# Patient Record
Sex: Female | Born: 1995 | Race: Black or African American | Hispanic: No | Marital: Single | Smoking: Never smoker
Health system: Southern US, Community
[De-identification: ages and names within clinical notes are randomized; demographics above are authoritative.]

## PROBLEM LIST (undated history)

## (undated) DIAGNOSIS — Z9109 Other allergy status, other than to drugs and biological substances: Secondary | ICD-10-CM

## (undated) DIAGNOSIS — J45909 Unspecified asthma, uncomplicated: Secondary | ICD-10-CM

## (undated) DIAGNOSIS — E119 Type 2 diabetes mellitus without complications: Secondary | ICD-10-CM

## (undated) DIAGNOSIS — E669 Obesity, unspecified: Secondary | ICD-10-CM

## (undated) DIAGNOSIS — I10 Essential (primary) hypertension: Secondary | ICD-10-CM

## (undated) DIAGNOSIS — E063 Autoimmune thyroiditis: Secondary | ICD-10-CM

## (undated) DIAGNOSIS — L83 Acanthosis nigricans: Secondary | ICD-10-CM

## (undated) HISTORY — DX: Obesity, unspecified: E66.9

## (undated) HISTORY — DX: Other allergy status, other than to drugs and biological substances: Z91.09

## (undated) HISTORY — DX: Acanthosis nigricans: L83

## (undated) HISTORY — DX: Unspecified asthma, uncomplicated: J45.909

## (undated) HISTORY — DX: Essential (primary) hypertension: I10

## (undated) HISTORY — DX: Type 2 diabetes mellitus without complications: E11.9

## (undated) HISTORY — DX: Autoimmune thyroiditis: E06.3

## (undated) HISTORY — PX: OTHER SURGICAL HISTORY: SHX169

---
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
## 2013-02-09 ENCOUNTER — Other Ambulatory Visit: Payer: Self-pay | Admitting: *Deleted

## 2013-02-23 LAB — COMPREHENSIVE METABOLIC PANEL
Albumin: 4.3 g/dL (ref 3.5–5.2)
CO2: 31 mEq/L (ref 19–32)
Calcium: 9.9 mg/dL (ref 8.4–10.5)
Chloride: 99 mEq/L (ref 96–112)
Glucose, Bld: 244 mg/dL — ABNORMAL HIGH (ref 70–99)
Sodium: 138 mEq/L (ref 135–145)
Total Bilirubin: 0.5 mg/dL (ref 0.3–1.2)
Total Protein: 7.5 g/dL (ref 6.0–8.3)

## 2013-02-23 LAB — LIPID PANEL
HDL: 41 mg/dL (ref >39)
Total CHOL/HDL Ratio: 4.2 Ratio

## 2013-02-24 LAB — MICROALBUMIN / CREATININE URINE RATIO
Creatinine, Urine: 188.1 mg/dL
Microalb, Ur: 23.73 mg/dL — ABNORMAL HIGH (ref 0.00–1.89)

## 2013-02-24 LAB — C-PEPTIDE: C-Peptide: 4.61 ng/mL — ABNORMAL HIGH (ref 0.80–3.90)

## 2013-02-26 ENCOUNTER — Ambulatory Visit (INDEPENDENT_AMBULATORY_CARE_PROVIDER_SITE_OTHER): Payer: Medicaid Other | Admitting: "Endocrinology

## 2013-02-26 VITALS — BP 117/81 | HR 77 | Wt 241.6 lb

## 2013-02-26 DIAGNOSIS — E1065 Type 1 diabetes mellitus with hyperglycemia: Secondary | ICD-10-CM

## 2013-02-26 DIAGNOSIS — E1129 Type 2 diabetes mellitus with other diabetic kidney complication: Secondary | ICD-10-CM

## 2013-02-26 DIAGNOSIS — E063 Autoimmune thyroiditis: Secondary | ICD-10-CM

## 2013-02-26 DIAGNOSIS — L680 Hirsutism: Secondary | ICD-10-CM

## 2013-02-26 DIAGNOSIS — R809 Proteinuria, unspecified: Secondary | ICD-10-CM

## 2013-02-26 DIAGNOSIS — E7800 Pure hypercholesterolemia, unspecified: Secondary | ICD-10-CM

## 2013-02-26 DIAGNOSIS — B353 Tinea pedis: Secondary | ICD-10-CM

## 2013-02-26 DIAGNOSIS — I10 Essential (primary) hypertension: Secondary | ICD-10-CM

## 2013-02-26 DIAGNOSIS — E049 Nontoxic goiter, unspecified: Secondary | ICD-10-CM

## 2013-02-26 DIAGNOSIS — E038 Other specified hypothyroidism: Secondary | ICD-10-CM

## 2013-02-26 LAB — TESTOSTERONE, FREE, TOTAL, SHBG: Testosterone: 90 ng/dL — ABNORMAL HIGH (ref 15–40)

## 2013-02-26 LAB — POCT GLYCOSYLATED HEMOGLOBIN (HGB A1C): Hemoglobin A1C: 9.1

## 2013-02-26 MED ORDER — LISINOPRIL 10 MG PO TABS: 10.0000 mg | ORAL_TABLET | Freq: Every day | ORAL | Status: DC

## 2013-02-26 MED ORDER — LEVOTHYROXINE SODIUM 50 MCG PO TABS: 50.0000 ug | ORAL_TABLET | Freq: Every day | ORAL | Status: DC

## 2013-02-26 NOTE — Progress Notes (Signed)
Subjective:  Patient Name: Shelley Payne Date of Birth: 08-01-1995  MRN: [id number]  Shelley Payne  presents to the office today for follow-up evaluation and management of her T2DM, obesity, goiter, hirsutism, thyroiditis, acanthosis, hypothyroidism, hypertension, intolerance to generic metformin  HISTORY OF PRESENT ILLNESS:   Shelley Payne is a 17 y.o. African-American young woman.   Shelley Payne was unaccompanied.  1. Shelley Payne first presented to our clinic on 05/27/06 in referral from [place] [facility] for evaluation and management of newly diagnosed diabetes. She was then 18 years old.  A. On 05/17/2006 she had her annual PE. Glycosuria was noted. CBG was 350. Metformin was started at a dose of 500 mg at supper. Subsequent lab results showed a C-peptide of 12.63 (normal 0.80-3.90), HbA1c 7.6%, Anti-insulin antibody 0.2 (normal < 1.0), pancreatic islet cell antibody 5 (normal <5), TSH 2.934, free T4 0.88, and free T3 3.4  B. At her first visit, I obtained the following history. She had been born at 41 weeks and weighed 7 pounds, 3 oz. She was healthy in infancy and early childhood. She began to gain excessive weight around 17 years of age. By age 20 or 8 she had developed acanthosis nigricans of her posterior neck. At age 49 she developed sideburns and at age 3-12 she developed a mustache . She had had no surgeries or medication allergies. Her FH was positive for T2DM in the maternal grandfather and obesity in her mother and some maternal aunts. Paternal FH was unknown. SH included the fact that she and her two brothers lived with their mother. Dad was not in the picture. Her Reviw of Systems was positive for severe belly hunger/dyspepsia.   C. On physical exam her height was 157.2 cm, about the 65% for age. Her weight was 212.7 lbs, far exceeding the 97%. Her BMI was 39.3, also far exceeding the 97%. She was morbidly obese. She had 3+sideburns and 1-2+ mustache. Her thyroid gland was enlarged at  25+ gms. She had 4+ acanthosis of the posterior neck. Her abdomen was very large. I concurred with he diagnosis of T2DM, although the borderline elevation of her anti-GAD antibody was suspicious for the possibility of autoimmune diabetes in the future. I increased her metformin to 500 mg, twice daily and arranged for further diabetes education at our clinic and [facility].  2. During the subsequent 6 years, she has continued with her original problems and developed some new ones.    A. Obesity and T2DM: Her obesity and T2DM have remained essentially unchanged. Although she did lose weight in the first year of coming to [place], she subsequently stopped taking metformin and continued to gradually gain weight. On 05/25/10 she reached her maximum weight up until then of 236 lbs. Thereafter her weight decreased to 232. Her HbA1c values varied from 5.9% in early 2008 when she was losing weight to 9.5% when she was at her maximum weight. Onglyza, 5 mg/day, was initiated on 07/05/10. Her HbA1c subsequently decreased to 6.8%.  B. 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 She was hypothyroid.Her TPO antibody was markedly positive for Hashimoto's disease at 963.1. She started on Synthroid 25 mcg/day. On 07/02/08 I increased her Synthroid to 37.5 mcg/day, but she never increased the dose. Despite trying to contact her mother and [place] several times, we were never able to ensure that she increased the Synthroid dose.   C. 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 The hyperinsulinemia, in turn, caused an elevated testosterone level. Her testosterone was 119.53 on 06/05/07. She has used depilatory creams since then.   D. Acanthosis nigricans: The acanthosis nigricans is also due to hyperinsulinemia. Insulin directly stimulates the basal layer of the skin, causing  thickening and darkening.   E. Hypertension: BPs increased in parallel with weight gain.  F. Microalbuminuria: Her microalbumin/creatinine ratio was elevated at 77.7 on 04/27/10. Lisinopril, 2.5 mg, was started.    3. The patient's last [facility] visit was on 06/01/12. In the interim, she has been healthy. She misses many doses of her medications and has not been checking her BGs very often. Her medications include Synthroid, 25 mcg/day; lisinopril, 5 mg/day; and Onglyza 5 mg/day. She takes metformin, 500 mg in the mornings. She has not been walking during the summer months. Her nasal allergies are getting worse. She has had more chest tightness and wheezing this year.    4. Pertinent Review of Systems:  Constitutional: The patient feels "good". The patient seems healthy and active.  Eyes: Her vision is good with her current eyeglass prescription. She had an eye exam last November. There are no other recognized eye problems. Neck: The patient has no complaints of anterior neck swelling, soreness, tenderness, pressure, discomfort, or difficulty swallowing.   Heart: Heart rate increases with exercise or other physical activity. The patient has no complaints of palpitations, irregular heart beats, chest pain, or chest pressure.   Gastrointestinal: Bowel movents seem normal. The patient has no complaints of excessive hunger, acid reflux, upset stomach, stomach aches or pains, diarrhea, or constipation.  Legs: Muscle mass and strength seem normal. There are no complaints of numbness, tingling, burning, or pain. No edema is noted.  Feet: There are no obvious foot problems. There are no complaints of numbness, tingling, burning, or pain. No edema is noted. Neurologic: There are no recognized problems with muscle movement and strength, sensation, or coordination. GYN: LMP is now. Periods have been regular.  Skin: Facial hair is not better or worse. She still uses depilatory creams 1-2 times per month.   PAST  MEDICAL, FAMILY, AND SOCIAL HISTORY  Past Medical History  Diagnosis Date  . Asthma   . Environmental allergies   . Obesity   . Thyroiditis, autoimmune   . Hypothyroidism, acquired, autoimmune   . Diabetes mellitus, type II   . Acanthosis nigricans, acquired   . Hypertension     Family History  Problem Relation Age of Onset  . Obesity Mother   . Obesity Maternal Aunt   . Diabetes Maternal Grandfather     Current outpatient prescriptions:lisinopril (PRINIVIL,ZESTRIL) 2.5 MG tablet, take 1 tablet by mouth once daily, Disp: 30 tablet, Rfl: 5;  metFORMIN (GLUCOPHAGE) 500 MG tablet, Take 500 mg by mouth 2 (two) times daily with a meal., Disp: , Rfl: ;  ONGLYZA 5 MG TABS tablet, take 1 tablet by mouth once daily, Disp: 30 tablet, Rfl: 5;  SYNTHROID 25 MCG tablet, take 1 and 1/2 tablet by mouth once daily, Disp: 30 each, Rfl: 6  Allergies as of 02/26/2013  . (No Known Allergies)     reports that she has never smoked. She has never used smokeless tobacco. She reports that she does not drink alcohol or use illicit drugs. Pediatric History  Patient Guardian Status  . Not on file.   Other Topics Concern  . Not on file   Social History Narrative  . No narrative on file    1.  School and Family: She graduated from high school She will start at [employer] on 03/12/13. 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  2. Activities: She plans to walk more. 3. Primary Care Provider: Family Practice at [facility]  REVIEW OF SYSTEMS: There are no other significant problems involving Shelley Payne's other body systems.   Objective:  Vital Signs:  BP 117/81  Pulse 77  Wt 241 lb 9.6 oz (109.589 kg)  She weighed 233 on 06/01/12 and 232 on 09/01/10. Her height in 2012 was 158.6 cm and her BMI was 42. Ht Readings from Last 3 Encounters:  No data found for Ht   Wt Readings from Last 3 Encounters:  02/26/13 241 lb 9.6 oz (109.589 kg) (99%*, Z = 2.41)  06/01/12 233 lb 12.8 oz (106.051 kg) (99%*, Z = 2.33)   *  Growth percentiles are based on CDC 2-20 Years data.   HC Readings from Last 3 Encounters:  No data found for Shelley Payne   There is no height on file to calculate BSA. No height on file for this encounter. 99%ile (Z=2.41) based on CDC 2-20 Years weight-for-age data.    PHYSICAL EXAM:  Constitutional: The patient appears healthy, but quite obese. She has gained about one pound per month for the past 8 months.  Head: The head is normocephalic. Face: The face appears normal, except for facial hair. She has a 1+ moustache. It has been several weeks since she's used her depilatory cream. She has grade 1 sideburns. Her cheeks have essentially no hair present. There are no obvious dysmorphic features. Eyes: The eyes appear to be normally formed and spaced. Gaze is conjugate. There is no obvious arcus or proptosis. Moisture appears normal. Ears: The ears are normally placed and appear externally normal. Mouth: The oropharynx and tongue appear normal. Dentition appears to be normal for age. Oral moisture is normal. Neck: The neck appears to be visibly normal. No carotid bruits are noted. The thyroid gland is 20-25 grams in size. The consistency of the thyroid gland is relatively firm. The thyroid gland is not tender to palpation. Lungs: The lungs are clear to auscultation. Air movement is good. Heart: Heart rate and rhythm are regular. Heart sounds S1 and S2 are normal. I did not appreciate any pathologic cardiac murmurs. Abdomen: The abdomen is quite enlarged. Bowel sounds are normal. There is no obvious hepatomegaly, splenomegaly, or other mass effect.  Arms: Muscle size and bulk are normal for age. Hands: There is no obvious tremor. Phalangeal and metacarpophalangeal joints are normal. Palmar muscles are normal for age. Palmar skin is normal. Palmar moisture is also normal. Legs: Muscles appear normal for age. No edema is present. Feet: Dorsalis pedis pulses are faint 1+. She has 1+ tinea pedis.   Neurologic: Strength is normal for age in both the upper and lower extremities. Muscle tone is normal. Sensation to touch is normal in both the legs and the soles of the feet.     LAB DATA:   Results for orders placed in visit on 02/26/13 (from the past 504 hour(s))  GLUCOSE, POCT (MANUAL RESULT ENTRY)   Collection Time    02/26/13  8:19 AM      Result Value Range   POC Glucose 257 (*) 70 - 99 mg/dl  POCT GLYCOSYLATED HEMOGLOBIN (HGB A1C)   Collection Time    02/26/13  8:21 AM      Result Value Range   Hemoglobin A1C 9.1    Results for orders placed in visit  on 02/09/13 (from the past 504 hour(s))  C-PEPTIDE   Collection Time    02/23/13  2:41 PM      Result Value Range   C-Peptide 4.61 (*) 0.80 - 3.90 ng/mL  T3, FREE   Collection Time    02/23/13  2:41 PM      Result Value Range   T3, Free 3.1  2.3 - 4.2 pg/mL  T4, FREE   Collection Time    02/23/13  2:41 PM      Result Value Range   Free T4 0.92  0.80 - 1.80 ng/dL  TSH   Collection Time    02/23/13  2:41 PM      Result Value Range   TSH 5.529 (*) 0.350 - 4.500 uIU/mL  LIPID PANEL   Collection Time    02/23/13  2:41 PM      Result Value Range   Cholesterol 174  0 - 200 mg/dL   Triglycerides 409  <811 mg/dL   HDL 41  >91 mg/dL   Total CHOL/HDL Ratio 4.2     VLDL 21  0 - 40 mg/dL   LDL Cholesterol 478 (*) 0 - 99 mg/dL  TESTOSTERONE, FREE, TOTAL   Collection Time    02/23/13  2:41 PM      Result Value Range   Testosterone 90 (*) 15 - 40 ng/dL   Sex Hormone Binding       Testosterone, Free       Testosterone-% Freee.      COMPREHENSIVE METABOLIC PANEL   Collection Time    02/23/13  2:41 PM      Result Value Range   Sodium 138  135 - 145 mEq/L   Potassium 4.6  3.5 - 5.3 mEq/L   Chloride 99  96 - 112 mEq/L   CO2 31  19 - 32 mEq/L   Glucose, Bld 244 (*) 70 - 99 mg/dL   BUN 9  6 - 23 mg/dL   Creat 2.95  6.21 - 3.08 mg/dL   Total Bilirubin 0.5  0.3 - 1.2 mg/dL   Alkaline Phosphatase 80  39 - 117 U/L   AST  40 (*) 0 - 37 U/L   ALT 62 (*) 0 - 35 U/L   Total Protein 7.5  6.0 - 8.3 g/dL   Albumin 4.3  3.5 - 5.2 g/dL   Calcium 9.9  8.4 - 65.7 mg/dL  MICROALBUMIN / CREATININE URINE RATIO   Collection Time    02/23/13  2:41 PM      Result Value Range   Microalb, Ur 23.73 (*) 0.00 - 1.89 mg/dL   Creatinine, Urine 846.9     Microalb Creat Ratio 126.2 (*) 0.0 - 30.0 mg/g  Her HbA1c was 9.1% today, compared with 7.6% in November 2013 and with 6.8% in February 2012.  Labs 8.02.14: C-peptide 4.61 (0.80-3.90); TSH 5.529, free T4 0.92, free T3 3.1; cholesterol 174, triglycerides 06, HDL 41, LDL 112; testosterone 90, free testosterone 23.1; CMP normal, except for glucose 244, AST 40, and LT 62; microalbumin/creatinine ratio 126.2 Labs 11/06/10: TSH 6.792, free T4 0.98, free T3 3.4   Assessment and Plan:   ASSESSMENT:  1. T2DM: Her BG control is much worse. Despite her best intentions she simply has not made the lifestyle changes that she needs to make.  2. Obesity: She is still morbidly obese.  3. Goiter: Her thyroid gland is about the same size as it was in 2012 and 2013.  4. Hypothyroid, acquired:  She is hypothyroid again, presumably due to continuing losses of thyrocytes. Assuming that she is telling the truth when she says that she takes her Synthroid reliably, it is time to increase her dose.   5. Hirsutism: Her facial hair looks less obvious because she has been using a depilatory cream. The distribution of facial hair, however, is unchanged. Her testosterone level is essentially unchanged and is about 3 X the upper limit of normal.  6. Hypertension: Her BP is not better. She needs an increase in lisinopril.  7. Microalbuminuria: Her urinary microalbumin/creatinine ratio is much too high. Fortunately, this is reversible if she gets her DM and HTN under control.  8. Hashimoto's thyroiditis: Her thyroiditis is clinically quiescent.  9. Tinea pedis: This is mild today.  10. Hypercholesterolemia: This is  worse, due mostly to weight gain.   PLAN:  1. Diagnostic: TFTs, urinary microalbumin/creatinine ratio in 2 monthsl. 2. Therapeutic: Increase Synthroid to 50 mcg/day. Increase lisinopril to 10 mg/day. Start Victoza, 0.6 mg/day. Check BGs before breakfast and before supper. Take all meds as prescribed. Eat Right Diet and Shelley Payne [facility] Diet. Refer to [place] [facility]. Ketoconazole, 2% cream, bid. Walk an hour per day.  3. Patient education: We discussed the vicious cycle of weight gain, cytokine production, insulin resistance, hyperinsulinemia, and adverse effects of both the cytokines and the hyperinsulinemia. Our clinic nurse, Gearldine Bienenstock, and I taught her how to use the Victoza pen. 4. Follow-up: 1 month  Level of Service: This visit lasted in excess of 90 minutes. More than 50% of the visit was devoted to counseling.  David Stall

## 2013-02-26 NOTE — Patient Instructions (Signed)
Follow up visit in 1 month. Start Victoza, 0.6 mg by subcutaneous injection each morning before breakfa7r. Increase synthroid to 50 mcg each morning. Increase lisinopril to 10 mg each morning. Call Dr. Fransico Michael next Monday evening between 8-10 PM.

## 2013-02-27 ENCOUNTER — Encounter: Payer: Self-pay | Admitting: "Endocrinology

## 2013-04-04 ENCOUNTER — Ambulatory Visit: Payer: Medicaid Other | Admitting: "Endocrinology

## 2013-08-03 ENCOUNTER — Ambulatory Visit: Payer: Self-pay | Admitting: Pediatrics

## 2013-08-30 ENCOUNTER — Ambulatory Visit: Payer: Self-pay | Admitting: Pediatrics

## 2014-05-31 ENCOUNTER — Ambulatory Visit (INDEPENDENT_AMBULATORY_CARE_PROVIDER_SITE_OTHER): Payer: Medicaid Other | Admitting: Family Medicine

## 2014-05-31 ENCOUNTER — Encounter: Payer: Self-pay | Admitting: Family Medicine

## 2014-05-31 VITALS — BP 131/87 | HR 78 | Temp 98.1°F | Ht 62.0 in | Wt 235.9 lb

## 2014-05-31 DIAGNOSIS — E038 Other specified hypothyroidism: Secondary | ICD-10-CM | POA: Diagnosis not present

## 2014-05-31 DIAGNOSIS — Z7689 Persons encountering health services in other specified circumstances: Secondary | ICD-10-CM

## 2014-05-31 DIAGNOSIS — Z7189 Other specified counseling: Secondary | ICD-10-CM | POA: Diagnosis not present

## 2014-05-31 DIAGNOSIS — I10 Essential (primary) hypertension: Secondary | ICD-10-CM | POA: Diagnosis not present

## 2014-05-31 DIAGNOSIS — N939 Abnormal uterine and vaginal bleeding, unspecified: Secondary | ICD-10-CM | POA: Diagnosis not present

## 2014-05-31 DIAGNOSIS — E063 Autoimmune thyroiditis: Secondary | ICD-10-CM

## 2014-05-31 DIAGNOSIS — E1165 Type 2 diabetes mellitus with hyperglycemia: Secondary | ICD-10-CM | POA: Diagnosis not present

## 2014-05-31 DIAGNOSIS — IMO0002 Reserved for concepts with insufficient information to code with codable children: Secondary | ICD-10-CM | POA: Insufficient documentation

## 2014-05-31 LAB — CBC
HCT: 36.9 % (ref 36.0–46.0)
HEMOGLOBIN: 11.3 g/dL — AB (ref 12.0–15.0)
MCH: 21.6 pg — ABNORMAL LOW (ref 26.0–34.0)
MCHC: 30.6 g/dL (ref 30.0–36.0)
MCV: 70.4 fL — ABNORMAL LOW (ref 78.0–100.0)
Platelets: 345 10*3/uL (ref 150–400)
RBC: 5.24 MIL/uL — AB (ref 3.87–5.11)
RDW: 16.4 % — ABNORMAL HIGH (ref 11.5–15.5)
WBC: 8.6 10*3/uL (ref 4.0–10.5)

## 2014-05-31 LAB — POCT GLYCOSYLATED HEMOGLOBIN (HGB A1C): Hemoglobin A1C: 11.3

## 2014-05-31 NOTE — Assessment & Plan Note (Deleted)
Lapse in insurance.  Patient has not been followed by endocrinologist in 2 years.  Patient still has thyroid. -Synthroid 25 mcg qd. -TSH, FT3, FT4 -Recommend contacting Dr Brennan (endo) for appointment 

## 2014-05-31 NOTE — Assessment & Plan Note (Signed)
BP 131/87.  Patient is on an unknown BP medication.  No CP, SOB, vision changes. -She will bring all meds to physical for review.  Will discuss compliance with medication and consider adjustment of therapy at that time. -Goal BP in a DM 130/80.

## 2014-05-31 NOTE — Assessment & Plan Note (Addendum)
HgbA1C 11.3 up from 9.1 in 2014.  Patient reports poor compliance with medications.  Denies symptoms of hypo or hyperglycemia. -Encouraged to re-establish with Dr Fransico MichaelBrennan for DM and thyroid management. -Will counsel at length at physical importance of compliance with medication. -Referral to Dr Gerilyn PilgrimSykes for nutrition counseling.

## 2014-05-31 NOTE — Assessment & Plan Note (Signed)
Concerns for prolonged bleeding during periods.  Denies fatigue, syncope -CBC ordered -Will review labs with patient.

## 2014-05-31 NOTE — Assessment & Plan Note (Signed)
Lapse in insurance.  Patient has not been followed by endocrinologist in 2 years.  Patient still has thyroid. -Synthroid 25 mcg qd. -TSH, FT3, FT4 -Recommend contacting Dr Fransico MichaelBrennan (endo) for appointment

## 2014-05-31 NOTE — Progress Notes (Signed)
Patient ID: Tyson AliasJauniquia Lem, female   DOB: 05-11-1996, 18 y.o.   MRN: [id number]    Subjective: CC: new patient HPI: Patient is a 18 y.o. female presenting to clinic today for new patient. Concerns today include:  1. Period Patient reports that over the last year, her periods have been lasting longer than normal.  She sometimes spots up to 8 weeks.  She denies syncope, dizziness, lightheadedness, nausea, vomiting, peripheral changes in sensation, fatigue, or headache.She does report that she has recently become sexually active and is interested in contraception.  She would like information regarding the various forms.  2. HTN/DMT2 Patient voices poor compliance with medications.  She forgets to take them.  She does state that she is very interested in seeing a dietician for nutrition, HTN, and DM adjunctive management. Denies CP, SOB, change in vision, change in sensation, foot ulcers.  History Reviewed: non smoker. Health Maintenance: Flu shot, DM foot exam at physical  ROS: All other systems reviewed and are negative.  Objective: Office vital signs reviewed. There were no vitals taken for this visit.  Physical Examination:  General: Awake, alert, well appearing, obese female, NAD HEENT: Atraumatic, normocephalic, EOMI, MMM MSK: Normal gait and station Skin: dry, intact, no rashes or lesions Neuro: Strength and sensation grossly intact, no focal deficits   Assessment: 18 y.o. female here for to establish care.  Plan: See Problem List and After Visit Summary  Raliegh IpAshly M Gottschalk, DO PGY-1, [facility]

## 2014-05-31 NOTE — Patient Instructions (Signed)
It was a pleasure meeting you today!  Information regarding what we discussed is included in this packet.  Please feel free to call our office if any questions or concerns arise.  1. Please make sure to schedule an appointment for your physical.  We will review your labs at that time.   2. Make sure that you call Dr Fransico MichaelBrennan to see if he wants to continue following you for your thyroid and diabetes 3. Make sure that you bring in ALL of your medications to our next appointment 4. Please make sure to use a barrier form of birth control (condom) until you have been on birthcontrol for at least 1 month.  Ashly M. Gottschalk, DO    Contraception Choices Contraception (birth control) is the use of any methods or devices to prevent pregnancy. Below are some methods to help avoid pregnancy. HORMONAL METHODS   Contraceptive implant. This is a thin, plastic tube containing progesterone hormone. It does not contain estrogen hormone. Your health care provider inserts the tube in the inner part of the upper arm. The tube can remain in place for up to 3 years. After 3 years, the implant must be removed. The implant prevents the ovaries from releasing an egg (ovulation), thickens the cervical mucus to prevent sperm from entering the uterus, and thins the lining of the inside of the uterus.  Progesterone-only injections. These injections are given every 3 months by your health care provider to prevent pregnancy. This synthetic progesterone hormone stops the ovaries from releasing eggs. It also thickens cervical mucus and changes the uterine lining. This makes it harder for sperm to survive in the uterus.  Birth control pills. These pills contain estrogen and progesterone hormone. They work by preventing the ovaries from releasing eggs (ovulation). They also cause the cervical mucus to thicken, preventing the sperm from entering the uterus. Birth control pills are prescribed by a health care provider.Birth control  pills can also be used to treat heavy periods.  Minipill. This type of birth control pill contains only the progesterone hormone. They are taken every day of each month and must be prescribed by your health care provider.  Birth control patch. The patch contains hormones similar to those in birth control pills. It must be changed once a week and is prescribed by a health care provider.  Vaginal ring. The ring contains hormones similar to those in birth control pills. It is left in the vagina for 3 weeks, removed for 1 week, and then a new one is put back in place. The patient must be comfortable inserting and removing the ring from the vagina.A health care provider's prescription is necessary.  Emergency contraception. Emergency contraceptives prevent pregnancy after unprotected sexual intercourse. This pill can be taken right after sex or up to 5 days after unprotected sex. It is most effective the sooner you take the pills after having sexual intercourse. Most emergency contraceptive pills are available without a prescription. Check with your pharmacist. Do not use emergency contraception as your only form of birth control. BARRIER METHODS   Female condom. This is a thin sheath (latex or rubber) that is worn over the penis during sexual intercourse. It can be used with spermicide to increase effectiveness.  Female condom. This is a soft, loose-fitting sheath that is put into the vagina before sexual intercourse.  Diaphragm. This is a soft, latex, dome-shaped barrier that must be fitted by a health care provider. It is inserted into the vagina, along with a spermicidal  jelly. It is inserted before intercourse. The diaphragm should be left in the vagina for 6 to 8 hours after intercourse.  Cervical cap. This is a round, soft, latex or plastic cup that fits over the cervix and must be fitted by a health care provider. The cap can be left in place for up to 48 hours after intercourse.  Sponge. This  is a soft, circular piece of polyurethane foam. The sponge has spermicide in it. It is inserted into the vagina after wetting it and before sexual intercourse.  Spermicides. These are chemicals that kill or block sperm from entering the cervix and uterus. They come in the form of creams, jellies, suppositories, foam, or tablets. They do not require a prescription. They are inserted into the vagina with an applicator before having sexual intercourse. The process must be repeated every time you have sexual intercourse. INTRAUTERINE CONTRACEPTION  Intrauterine device (IUD). This is a T-shaped device that is put in a woman's uterus during a menstrual period to prevent pregnancy. There are 2 types:  Copper IUD. This type of IUD is wrapped in copper wire and is placed inside the uterus. Copper makes the uterus and fallopian tubes produce a fluid that kills sperm. It can stay in place for 10 years.  Hormone IUD. This type of IUD contains the hormone progestin (synthetic progesterone). The hormone thickens the cervical mucus and prevents sperm from entering the uterus, and it also thins the uterine lining to prevent implantation of a fertilized egg. The hormone can weaken or kill the sperm that get into the uterus. It can stay in place for 3-5 years, depending on which type of IUD is used. PERMANENT METHODS OF CONTRACEPTION  Female tubal ligation. This is when the woman's fallopian tubes are surgically sealed, tied, or blocked to prevent the egg from traveling to the uterus.  Hysteroscopic sterilization. This involves placing a small coil or insert into each fallopian tube. Your doctor uses a technique called hysteroscopy to do the procedure. The device causes scar tissue to form. This results in permanent blockage of the fallopian tubes, so the sperm cannot fertilize the egg. It takes about 3 months after the procedure for the tubes to become blocked. You must use another form of birth control for these 3  months.  Female sterilization. This is when the female has the tubes that carry sperm tied off (vasectomy).This blocks sperm from entering the vagina during sexual intercourse. After the procedure, the man can still ejaculate fluid (semen). NATURAL PLANNING METHODS  Natural family planning. This is not having sexual intercourse or using a barrier method (condom, diaphragm, cervical cap) on days the woman could become pregnant.  Calendar method. This is keeping track of the length of each menstrual cycle and identifying when you are fertile.  Ovulation method. This is avoiding sexual intercourse during ovulation.  Symptothermal method. This is avoiding sexual intercourse during ovulation, using a thermometer and ovulation symptoms.  Post-ovulation method. This is timing sexual intercourse after you have ovulated. Regardless of which type or method of contraception you choose, it is important that you use condoms to protect against the transmission of sexually transmitted infections (STIs). Talk with your health care provider about which form of contraception is most appropriate for you. Document Released: 07/12/2005 Document Revised: 07/17/2013 Document Reviewed: 01/04/2013 [facility] Patient Information 2015 [place], [place]. This information is not intended to replace advice given to you by your health care provider. Make sure you discuss any questions you have with your  health care provider.  

## 2014-06-01 ENCOUNTER — Encounter: Payer: Self-pay | Admitting: Family Medicine

## 2014-06-01 LAB — COMPREHENSIVE METABOLIC PANEL
ALBUMIN: 4.3 g/dL (ref 3.5–5.2)
ALK PHOS: 74 U/L (ref 39–117)
ALT: 69 U/L — ABNORMAL HIGH (ref 0–35)
AST: 45 U/L — AB (ref 0–37)
BUN: 8 mg/dL (ref 6–23)
CALCIUM: 9.7 mg/dL (ref 8.4–10.5)
CHLORIDE: 98 meq/L (ref 96–112)
CO2: 27 mEq/L (ref 19–32)
Creat: 0.55 mg/dL (ref 0.50–1.10)
GLUCOSE: 234 mg/dL — AB (ref 70–99)
POTASSIUM: 4.6 meq/L (ref 3.5–5.3)
SODIUM: 136 meq/L (ref 135–145)
TOTAL PROTEIN: 7.5 g/dL (ref 6.0–8.3)
Total Bilirubin: 0.4 mg/dL (ref 0.2–1.2)

## 2014-06-01 LAB — T3, FREE: T3, Free: 3.2 pg/mL (ref 2.3–4.2)

## 2014-06-01 LAB — T4, FREE: Free T4: 1 ng/dL (ref 0.80–1.80)

## 2014-06-01 LAB — TSH: TSH: 2.755 u[IU]/mL (ref 0.350–4.500)

## 2014-06-11 ENCOUNTER — Encounter: Payer: Self-pay | Admitting: Family Medicine

## 2014-06-11 ENCOUNTER — Ambulatory Visit (INDEPENDENT_AMBULATORY_CARE_PROVIDER_SITE_OTHER): Payer: Medicaid Other | Admitting: Family Medicine

## 2014-06-11 VITALS — BP 108/88 | HR 84 | Temp 98.3°F | Resp 18 | Wt 235.0 lb

## 2014-06-11 DIAGNOSIS — Z3009 Encounter for other general counseling and advice on contraception: Secondary | ICD-10-CM | POA: Diagnosis present

## 2014-06-11 DIAGNOSIS — Z113 Encounter for screening for infections with a predominantly sexual mode of transmission: Secondary | ICD-10-CM | POA: Insufficient documentation

## 2014-06-11 NOTE — Assessment & Plan Note (Signed)
Patient wants Mirena placed -Various methods of contraception were discussed including barrier, implants, injection, oral pills, patch, ring and IUD -Patient to schedule appointment for IUD insertion -Patient to premedicate with Motrin 400mg  PO 30 minutes before procedure -Patient voices good understanding of procedure and aftercare

## 2014-06-11 NOTE — Patient Instructions (Signed)
It was a pleasure seeing you today!  Information regarding what we discussed is included in this packet.  Please feel free to call our office if any questions or concerns arise.  Make an appointment for IUD insertion.  Take 400mg  of Motrin 30 minutes before coming into the procedure.  Ashly M. Gottschalk, DO  Intrauterine Device Information An intrauterine device (IUD) is inserted into your uterus to prevent pregnancy. There are two types of IUDs available:   Copper IUD--This type of IUD is wrapped in copper wire and is placed inside the uterus. Copper makes the uterus and fallopian tubes produce a fluid that kills sperm. The copper IUD can stay in place for 10 years.  Hormone IUD--This type of IUD contains the hormone progestin (synthetic progesterone). The hormone thickens the cervical mucus and prevents sperm from entering the uterus. It also thins the uterine lining to prevent implantation of a fertilized egg. The hormone can weaken or kill the sperm that get into the uterus. One type of hormone IUD can stay in place for 5 years, and another type can stay in place for 3 years. Your health care provider will make sure you are a good candidate for a contraceptive IUD. Discuss with your health care provider the possible side effects.  ADVANTAGES OF AN INTRAUTERINE DEVICE  IUDs are highly effective, reversible, long acting, and low maintenance.   There are no estrogen-related side effects.   An IUD can be used when breastfeeding.   IUDs are not associated with weight gain.   The copper IUD works immediately after insertion.   The hormone IUD works right away if inserted within 7 days of your period starting. You will need to use a backup method of birth control for 7 days if the hormone IUD is inserted at any other time in your cycle.  The copper IUD does not interfere with your female hormones.   The hormone IUD can make heavy menstrual periods lighter and decrease cramping.    The hormone IUD can be used for 3 or 5 years.   The copper IUD can be used for 10 years. DISADVANTAGES OF AN INTRAUTERINE DEVICE  The hormone IUD can be associated with irregular bleeding patterns.   The copper IUD can make your menstrual flow heavier and more painful.   You may experience cramping and vaginal bleeding after insertion.  Document Released: 06/15/2004 Document Revised: 03/14/2013 Document Reviewed: 12/31/2012 [facility] Patient Information 2015 [place], [place]. This information is not intended to replace advice given to you by your health care provider. Make sure you discuss any questions you have with your health care provider.

## 2014-06-11 NOTE — Progress Notes (Signed)
Patient ID: Tyson AliasJauniquia Caban, female   DOB: 1995/11/04, 18 y.o.   MRN: [id number]    Subjective: CC: discuss contraception HPI: Patient is a 18 y.o. female presenting to clinic today to discuss contraception. Concerns today include:  Patient reports that she continues to have unpredictable periods.  She states that they can last weeks at a time and that she never knows when she might get one.  LMP was first week in November.  Cycle was not prolonged that time.  She admits to being sexually active.  No concerns for pregnancy or STI.  She is in a monogamous relationship with a female.  No h/o of pregnancy ever.  She has never used hormonal contraception in the past.  After reviewing contraception choices given in handout at last visit, patient would like to have an IUD inserted.  History Reviewed: non smoker. Health Maintenance: due for physical  ROS: All other systems reviewed and are negative.  Objective: Office vital signs reviewed. BP 108/88 mmHg  Pulse 84  Temp(Src) 98.3 F (36.8 C) (Oral)  Resp 18  Wt 235 lb (106.595 kg)  SpO2 99%  LMP 05/17/2014 (Exact Date)  Physical Examination:  General: Awake, alert, obese female, NAD HEENT: Atraumatic, normocephalic, EOMI MSK: Normal gait and station Psych: normal speech, affect appropriate, good eye contact  Assessment: 18 y.o. female here for contraception management  Plan: See Problem List and After Visit Summary   Raliegh IpAshly M Gottschalk, DO PGY-1, [facility]

## 2014-06-12 NOTE — Progress Notes (Signed)
Patient ID: Shelley Payne, female   DOB: 01-25-1996, 18 y.o.   MRN: [id number] Precepted and reviewed.  Agree with documentation and management of Dr. Nadine CountsGottschalk

## 2014-07-01 ENCOUNTER — Other Ambulatory Visit (HOSPITAL_COMMUNITY)
Admission: RE | Admit: 2014-07-01 | Discharge: 2014-07-01 | Disposition: A | Discharge status: Home / Self Care | Payer: PRIVATE HEALTH INSURANCE | Source: Ambulatory Visit | Attending: Family Medicine | Admitting: Family Medicine

## 2014-07-01 ENCOUNTER — Ambulatory Visit (INDEPENDENT_AMBULATORY_CARE_PROVIDER_SITE_OTHER): Payer: Medicaid Other | Admitting: Family Medicine

## 2014-07-01 ENCOUNTER — Encounter: Payer: Self-pay | Admitting: Family Medicine

## 2014-07-01 ENCOUNTER — Ambulatory Visit (INDEPENDENT_AMBULATORY_CARE_PROVIDER_SITE_OTHER): Payer: Medicaid Other | Admitting: *Deleted

## 2014-07-01 VITALS — BP 127/85 | HR 98 | Temp 98.2°F | Ht 62.0 in | Wt 232.6 lb

## 2014-07-01 DIAGNOSIS — Z113 Encounter for screening for infections with a predominantly sexual mode of transmission: Secondary | ICD-10-CM | POA: Insufficient documentation

## 2014-07-01 DIAGNOSIS — I10 Essential (primary) hypertension: Secondary | ICD-10-CM | POA: Diagnosis not present

## 2014-07-01 DIAGNOSIS — E063 Autoimmune thyroiditis: Secondary | ICD-10-CM

## 2014-07-01 DIAGNOSIS — N898 Other specified noninflammatory disorders of vagina: Secondary | ICD-10-CM | POA: Diagnosis not present

## 2014-07-01 DIAGNOSIS — E038 Other specified hypothyroidism: Secondary | ICD-10-CM | POA: Diagnosis not present

## 2014-07-01 DIAGNOSIS — E1165 Type 2 diabetes mellitus with hyperglycemia: Secondary | ICD-10-CM

## 2014-07-01 DIAGNOSIS — Z23 Encounter for immunization: Secondary | ICD-10-CM

## 2014-07-01 DIAGNOSIS — IMO0002 Reserved for concepts with insufficient information to code with codable children: Secondary | ICD-10-CM

## 2014-07-01 DIAGNOSIS — N939 Abnormal uterine and vaginal bleeding, unspecified: Secondary | ICD-10-CM | POA: Diagnosis not present

## 2014-07-01 LAB — POCT WET PREP (WET MOUNT): Clue Cells Wet Prep Whiff POC: NEGATIVE

## 2014-07-01 MED ORDER — SAXAGLIPTIN HCL 5 MG PO TABS: 5.0000 mg | ORAL_TABLET | Freq: Every day | ORAL | Status: DC

## 2014-07-01 MED ORDER — METFORMIN HCL 500 MG PO TABS: 500.0000 mg | ORAL_TABLET | Freq: Two times a day (BID) | ORAL | Status: DC

## 2014-07-01 MED ORDER — LIRAGLUTIDE 18 MG/3ML ~~LOC~~ SOPN: PEN_INJECTOR | SUBCUTANEOUS | Status: DC

## 2014-07-01 MED ORDER — LEVOTHYROXINE SODIUM 50 MCG PO TABS: 50.0000 ug | ORAL_TABLET | Freq: Every day | ORAL | Status: DC

## 2014-07-01 NOTE — Progress Notes (Signed)
Patient ID: Shelley Payne, female   DOB: [birth year], 18 y.o.   MRN: [id number] Subjective:     Shelley Payne is a 18 y.o. female and is here for a comprehensive physical exam. The patient reports problems - abnormal vaginal discharge.  Denies vaginal itching, foul odors, dysuria.  Admits to prolonged vaginal bleeding.Marland Kitchen.  History   Social History  . Marital Status: Single    Spouse Name: N/A    Number of Children: N/A  . Years of Education: N/A   Occupational History  . Not on file.   Social History Main Topics  . Smoking status: Never Smoker   . Smokeless tobacco: Never Used  . Alcohol Use: No  . Drug Use: No  . Sexual Activity: Not on file   Other Topics Concern  . Not on file   Social History Narrative   Health Maintenance  Topic Date Due  . PNEUMOCOCCAL POLYSACCHARIDE VACCINE (1) 02/20/1996  . FOOT EXAM  02/20/2004  . OPHTHALMOLOGY EXAM  02/20/2004  . TETANUS/TDAP  02/19/2013  . INFLUENZA VACCINE  02/23/2014  . URINE MICROALBUMIN  02/23/2014  . PAP SMEAR  02/20/2015 (Originally 02/20/2012)  . HEMOGLOBIN A1C  11/29/2014    The following portions of the patient's history were reviewed and updated as appropriate: allergies, current medications, past family history, past medical history, past social history, past surgical history and problem list.  Review of Systems Constitutional: negative Eyes: negative Ears, nose, mouth, throat, and face: negative Respiratory: negative Cardiovascular: negative Gastrointestinal: negative Genitourinary:positive for abnormal menstrual periods and vaginal discharge and prolonged bleeding Integument/breast: negative Hematologic/lymphatic: negative Musculoskeletal:negative Neurological: negative Behavioral/Psych: negative Endocrine: negative Allergic/Immunologic: negative   Objective:    BP 127/85 mmHg  Pulse 98  Temp(Src) 98.2 F (36.8 C) (Oral)  Ht 5\' 2"  (1.575 m)  Wt 232 lb 9.6 oz (105.507 kg)  BMI 42.53 kg/m2  LMP  01/24/2014 (Approximate) General appearance: alert, cooperative, appears stated age, no distress and morbidly obese Head: Normocephalic, without obvious abnormality, atraumatic Eyes: conjunctivae/corneas clear. PERRL, EOM's intact. Fundi benign. Nose: Nares normal. Septum midline. Mucosa normal. No drainage or sinus tenderness. Throat: lips, mucosa, and tongue normal; teeth and gums normal Neck: no adenopathy, no carotid bruit, no JVD, supple, symmetrical, trachea midline and thyroid: enlarged Back: symmetric, no curvature. ROM normal. No CVA tenderness. Lungs: clear to auscultation bilaterally Heart: regular rate and rhythm, S1, S2 normal, no murmur, click, rub or gallop Abdomen: soft, non-tender; bowel sounds normal; no masses,  no organomegaly Pelvic: cervix normal in appearance, external genitalia normal, no adnexal masses or tenderness, no cervical motion tenderness, rectovaginal septum normal, uterus normal size, shape, and consistency and vaginal bleeding Extremities: extremities normal, atraumatic, no cyanosis or edema Pulses: 2+ and symmetric Skin: Skin color, texture, turgor normal. No rashes or lesions or dry along the Lumbar spine. acanthosis nigrans on neck posteriorly.  B/l feet with dry, peeling skin Lymph nodes: Cervical, supraclavicular, and axillary nodes normal. Neurologic: Alert and oriented X 3, normal strength and tone. Normal symmetric reflexes. Normal coordination and gait    Diabetic foot exam: b/l feet sensation in tact, no ulcerations or callus, b/l feet with peeling and dry skin  Assessment:    Healthy female exam. Normal diabetic foot exam     Plan:     See After Visit Summary for Counseling Recommendations

## 2014-07-01 NOTE — Assessment & Plan Note (Addendum)
Last A1C 11.9.  Last BMET with normal renal function. Patient reports that she is going to schedule an appointment with Dr Fransico MichaelBrennan if her insurance covers him.  Otherwise, she is to schedule an appointment with me. -Patient to continue Metformin, Victoza, Onglyza (reordered for 3 months until patient can see Dr Fransico MichaelBrennan) -Patient to continue lifestyle modifications -Patient to monitor CBGs and keep record -F/U in 3 months.

## 2014-07-01 NOTE — Assessment & Plan Note (Signed)
Patient continues to have prolonged periods.  IUD insertion scheduled for next week.  Denies dizziness, lightheadedness, n/v, abdominal pain -IUD

## 2014-07-01 NOTE — Patient Instructions (Signed)
It was a pleasure seeing you today!  Information regarding what we discussed is included in this packet.  Please feel free to call our office if any questions or concerns arise. I am so glad that you are working to better your health!  Continue exercising at least 30 mins daily.  Take your medications daily.  Also don't forget to call and schedule an appointment with me for diabetes if Dr Fransico MichaelBrennan cannot see you due to insurance.  I will send in your meds as requested and mail your results of your test today.  Make plans to see me back in 3-6 months if Dr Fransico MichaelBrennan cannot see you.  Also, remember that you can pick up Lamisil cream or athlete's foot spray to help with the peeling on your feet.  Camie Hauss M. Merek Niu, DO  Diabetes and Exercise Exercising regularly is important. It is not just about losing weight. It has many health benefits, such as:  Improving your overall fitness, flexibility, and endurance.  Increasing your bone density.  Helping with weight control.  Decreasing your body fat.  Increasing your muscle strength.  Reducing stress and tension.  Improving your overall health. People with diabetes who exercise gain additional benefits because exercise:  Reduces appetite.  Improves the body's use of blood sugar (glucose).  Helps lower or control blood glucose.  Decreases blood pressure.  Helps control blood lipids (such as cholesterol and triglycerides).  Improves the body's use of the hormone insulin by:  Increasing the body's insulin sensitivity.  Reducing the body's insulin needs.  Decreases the risk for heart disease because exercising:  Lowers cholesterol and triglycerides levels.  Increases the levels of good cholesterol (such as high-density lipoproteins [HDL]) in the body.  Lowers blood glucose levels. YOUR ACTIVITY PLAN  Choose an activity that you enjoy and set realistic goals. Your health care provider or diabetes educator can help you make an activity  plan that works for you. Exercise regularly as directed by your health care provider. This includes:  Performing resistance training twice a week such as push-ups, sit-ups, lifting weights, or using resistance bands.  Performing 150 minutes of cardio exercises each week such as walking, running, or playing sports.  Staying active and spending no more than 90 minutes at one time being inactive. Even short bursts of exercise are good for you. Three 10-minute sessions spread throughout the day are just as beneficial as a single 30-minute session. Some exercise ideas include:  Taking the dog for a walk.  Taking the stairs instead of the elevator.  Dancing to your favorite song.  Doing an exercise video.  Doing your favorite exercise with a friend. RECOMMENDATIONS FOR EXERCISING WITH TYPE 1 OR TYPE 2 DIABETES   Check your blood glucose before exercising. If blood glucose levels are greater than 240 mg/dL, check for urine ketones. Do not exercise if ketones are present.  Avoid injecting insulin into areas of the body that are going to be exercised. For example, avoid injecting insulin into:  The arms when playing tennis.  The legs when jogging.  Keep a record of:  Food intake before and after you exercise.  Expected peak times of insulin action.  Blood glucose levels before and after you exercise.  The type and amount of exercise you have done.  Review your records with your health care provider. Your health care provider will help you to develop guidelines for adjusting food intake and insulin amounts before and after exercising.  If you take insulin or  oral hypoglycemic agents, watch for signs and symptoms of hypoglycemia. They include:  Dizziness.  Shaking.  Sweating.  Chills.  Confusion.  Drink plenty of water while you exercise to prevent dehydration or heat stroke. Body water is lost during exercise and must be replaced.  Talk to your health care provider before  starting an exercise program to make sure it is safe for you. Remember, almost any type of activity is better than none. Document Released: 10/02/2003 Document Revised: 11/26/2013 Document Reviewed: 12/19/2012 [facility] Patient Information 2015 [place], [place]. This information is not intended to replace advice given to you by your health care provider. Make sure you discuss any questions you have with your health care provider.

## 2014-07-01 NOTE — Assessment & Plan Note (Signed)
Patient reports that she is starting to take her hypothyroid medication again.   -Refill sent for Synthroid -Will repeat labs in 3 months, in the setting of noncompliance

## 2014-07-01 NOTE — Assessment & Plan Note (Signed)
Patient reports that she has started exercising regularly about 2 weeks ago.  She is also watching her diet and has started taking her diabetes and thyroid medications again.

## 2014-07-02 ENCOUNTER — Encounter: Payer: Self-pay | Admitting: Family Medicine

## 2014-07-02 LAB — CERVICOVAGINAL ANCILLARY ONLY
CHLAMYDIA, DNA PROBE: NEGATIVE
NEISSERIA GONORRHEA: NEGATIVE

## 2014-07-11 ENCOUNTER — Ambulatory Visit (INDEPENDENT_AMBULATORY_CARE_PROVIDER_SITE_OTHER): Payer: Medicaid Other | Admitting: Family Medicine

## 2014-07-11 VITALS — BP 130/82 | HR 82 | Temp 98.2°F | Ht 62.0 in | Wt 234.2 lb

## 2014-07-11 DIAGNOSIS — Z30430 Encounter for insertion of intrauterine contraceptive device: Secondary | ICD-10-CM

## 2014-07-11 LAB — POCT URINE PREGNANCY: PREG TEST UR: NEGATIVE

## 2014-07-11 MED ORDER — LEVONORGESTREL 20 MCG/24HR IU IUD
INTRAUTERINE_SYSTEM | Freq: Once | INTRAUTERINE | Status: AC
  Administered 2014-07-11: 1 via INTRAUTERINE

## 2014-07-11 NOTE — Addendum Note (Signed)
Addended by: Jone BasemanFLEEGER, JESSICA D on: 07/11/2014 10:48 AM   Modules accepted: Orders

## 2014-07-11 NOTE — Patient Instructions (Signed)

## 2014-07-11 NOTE — Progress Notes (Signed)
Patient ID: [name], female   DOB: 28-Jun-1996, 18 y.o.   MRN: [id number] IUD Insertion Procedure Note  Pre-operative Diagnosis: Contraception, IUD insertion.  Post-operative Diagnosis: same  Indications: contraception  Procedure Details  Urine pregnancy test was done today and result was negative.  The risks (including infection, bleeding, pain, and uterine perforation) and benefits of the procedure were explained to the patient,both verbal and Written informed consent was obtained.    Cervix cleansed with Betadine. Uterus sounded to 7 cm. IUD inserted without difficulty. String visible and trimmed. Patient tolerated procedure well.  IUD Information: Mirena, Lot # [id number].  Condition: Stable  Complications: None  Plan:  The patient was advised to call for any fever or for prolonged or severe pain or bleeding. She was advised to use OTC acetaminophen as needed for mild to moderate pain.   Attending Physician Documentation: Procedure performed by me.

## 2014-08-06 ENCOUNTER — Encounter: Payer: Self-pay | Admitting: Family Medicine

## 2014-08-06 ENCOUNTER — Ambulatory Visit (INDEPENDENT_AMBULATORY_CARE_PROVIDER_SITE_OTHER): Payer: 59 | Admitting: Family Medicine

## 2014-08-06 ENCOUNTER — Ambulatory Visit: Payer: Self-pay | Admitting: Family Medicine

## 2014-08-06 VITALS — BP 121/83 | HR 85 | Ht 62.0 in | Wt 234.0 lb

## 2014-08-06 DIAGNOSIS — T8339XA Other mechanical complication of intrauterine contraceptive device, initial encounter: Secondary | ICD-10-CM | POA: Insufficient documentation

## 2014-08-06 NOTE — Progress Notes (Signed)
Patient ID: Shelley Payne, female   DOB: 06/01/1996, 19 y.o.   MRN: [id number]    Subjective: CC:IUD fell out HPI: Patient is a 19 y.o. female presenting to clinic today for IUD fell out. Concerns today include:  1. IUD fell out Patient reports that IUD fell out during her LMP 07/26/2014.  She states that it fell in the toilet after a large blood clot passed.  She also notes that she was able to feel the strings when wiping after urinating.  I asked that she elaborate and she reports that strings were palpable outside of the vagina.  She states that she does feel that it was already improving her periods however, as she noticed her period was shorter in duration this month.  She uses condoms for contraception.  She has no concerns for pregnancy or STIs.  She voices that she would like the IUD re-inserted.  We discussed options, including Mirena again or trying Skyla since she is nulliparous.    Social History Reviewed: non smoker. FamHx and MedHx updated.  Please see EMR.  ROS: All other systems reviewed and are negative.  Objective: Office vital signs reviewed. BP 121/83 mmHg  Pulse 85  Ht 5\' 2"  (1.575 m)  Wt 234 lb (106.142 kg)  BMI 42.79 kg/m2  LMP 07/21/2014  Physical Examination:  General: Awake, alert, well nourished, pleasant female, NAD HEENT: Normal, EOMI MSK: Normal gait and station Psych: normal speech, good eye contact  Assessment: 19 y.o. female with IUD failure.  Plan: See Problem List and After Visit Summary   Raliegh IpAshly M Yedidya Duddy, DO PGY-1, [facility]

## 2014-08-06 NOTE — Patient Instructions (Signed)
It was a pleasure seeing you today!  Information regarding what we discussed is included in this packet.  Please feel free to call our office if any questions or concerns arise.  [facility]'s [facility] after you are all sorted out with your insurance stuff.  Schedule an appointment with [facility].  We will special order this for you.  Plan to schedule a follow up about 2 weeks after the insertion of the new IUD for string check.      Ashly M. Nadine CountsGottschalk, DO PGY-1, [facility]

## 2014-08-06 NOTE — Assessment & Plan Note (Addendum)
Mirena fell out.  Patient is requesting reinsertion of Skyla instead.  She continues to be sexually active, using barrier method with condoms.  LMP 07/26/14. -Recommend UPreg at re-insertion -Skyla available and will be held for patient per Shanda BumpsJessica -Patient to schedule during [facility] for IUD insertion once she has insurance straightened out. -Also recommend close follow up for string check 2 weeks after insertion of new device.

## 2014-10-03 ENCOUNTER — Ambulatory Visit (INDEPENDENT_AMBULATORY_CARE_PROVIDER_SITE_OTHER): Payer: 59 | Admitting: Family Medicine

## 2014-10-03 VITALS — BP 113/81 | HR 80 | Temp 99.0°F | Ht 62.0 in | Wt 236.2 lb

## 2014-10-03 DIAGNOSIS — Z30430 Encounter for insertion of intrauterine contraceptive device: Secondary | ICD-10-CM

## 2014-10-03 LAB — POCT URINE PREGNANCY: Preg Test, Ur: NEGATIVE

## 2014-10-03 MED ORDER — LEVONORGESTREL 13.5 MG IU IUD
1.0000 | INTRAUTERINE_SYSTEM | Freq: Once | INTRAUTERINE | Status: AC
  Administered 2014-10-03: 1 via INTRAUTERINE

## 2014-10-03 NOTE — Addendum Note (Signed)
Addended by: Jone BasemanFLEEGER, JESSICA D on: 10/03/2014 10:22 AM   Modules accepted: Orders

## 2014-10-03 NOTE — Progress Notes (Signed)
Patient ID: Shelley Payne, female   DOB: [birth year], 19 y.o.   MRN: [id number] Pt here for IUD insertion. Had Mirena placed in December 2015 and subsequent expulsion during menses a month later.  IUD INSERTION: Patient given informed consent, signed copy in the chart..  Negative pregnancy confirmed.  Appropriate time out taken.   Sterile instruments and technique was used. Cervix brought into view with use of speculum and then cleansed three times with  betadine swabs.  A tenaculum was placed into the anterior lip of the cervix and a uterine sound was used to measure uterine size.   A SKYLA IUD was placed into the endometrial cavity, deployed and secured. The applicator was removed. The strings were trimmed to 2 centimeters.   There were no complications and the patient tolerated the procedure well.   She was given handouts for post procedure instructions and information about the IUD including a card with the time of recommended removal. Dr. Beryle FlockBacigalupo perfrmed procedure. i was present for entire procedure,

## 2014-10-24 ENCOUNTER — Ambulatory Visit (INDEPENDENT_AMBULATORY_CARE_PROVIDER_SITE_OTHER): Payer: 59 | Admitting: Family Medicine

## 2014-10-24 ENCOUNTER — Encounter: Payer: Self-pay | Admitting: Family Medicine

## 2014-10-24 VITALS — Ht 62.0 in | Wt 231.2 lb

## 2014-10-24 DIAGNOSIS — IMO0002 Reserved for concepts with insufficient information to code with codable children: Secondary | ICD-10-CM

## 2014-10-24 DIAGNOSIS — E1165 Type 2 diabetes mellitus with hyperglycemia: Secondary | ICD-10-CM

## 2014-10-24 DIAGNOSIS — N946 Dysmenorrhea, unspecified: Secondary | ICD-10-CM | POA: Diagnosis not present

## 2014-10-24 MED ORDER — IBUPROFEN 600 MG PO TABS: 600.0000 mg | ORAL_TABLET | Freq: Three times a day (TID) | ORAL | Status: DC | PRN

## 2014-10-24 NOTE — Patient Instructions (Addendum)
It was a pleasure seeing you today, [name]! Your IUD looks to be in place.  Strings were visible.  Please remember that IUD only help to protect against pregnancy but do not prevent 100% of the time.  In addition, they do NOT protect against STDs.  Please continue to practice safe sex practices.   Please make an appointment to see me in 2-4 weeks for follow up on your diabetes.  We will check your A1c and a microalbumin from your urine at that time. Motrin 600mg  has been sent into your pharmacy.  You can take this 3 times a daily with food.  Please feel free to call our office at [phone] if any questions or concerns arise.  Warm Regards, Ashly M. Gottschalk, DO   Levonorgestrel intrauterine device (IUD) What is this medicine? LEVONORGESTREL IUD (LEE voe nor jes trel) is a contraceptive (birth control) device. The device is placed inside the uterus by a healthcare professional. It is used to prevent pregnancy and can also be used to treat heavy bleeding that occurs during your period. Depending on the device, it can be used for 3 to 5 years. This medicine may be used for other purposes; ask your health care provider or pharmacist if you have questions. COMMON BRAND NAME(S): Elveria RoyalsLILETTA, Mirena, Skyla What should I tell my health care provider before I take this medicine? They need to know if you have any of these conditions: -abnormal Pap smear -cancer of the breast, uterus, or cervix -diabetes -endometritis -genital or pelvic infection now or in the past -have more than one sexual partner or your partner has more than one partner -heart disease -history of an ectopic or tubal pregnancy -immune system problems -IUD in place -liver disease or tumor -problems with blood clots or take blood-thinners -use intravenous drugs -uterus of unusual shape -vaginal bleeding that has not been explained -an unusual or allergic reaction to levonorgestrel, other hormones, silicone, or  polyethylene, medicines, foods, dyes, or preservatives -pregnant or trying to get pregnant -breast-feeding How should I use this medicine? This device is placed inside the uterus by a health care professional. Talk to your pediatrician regarding the use of this medicine in children. Special care may be needed. Overdosage: If you think you have taken too much of this medicine contact a poison control center or emergency room at once. NOTE: This medicine is only for you. Do not share this medicine with others. What if I miss a dose? This does not apply. What may interact with this medicine? Do not take this medicine with any of the following medications: -amprenavir -bosentan -fosamprenavir This medicine may also interact with the following medications: -aprepitant -barbiturate medicines for inducing sleep or treating seizures -bexarotene -griseofulvin -medicines to treat seizures like carbamazepine, ethotoin, felbamate, oxcarbazepine, phenytoin, topiramate -modafinil -pioglitazone -rifabutin -rifampin -rifapentine -some medicines to treat HIV infection like atazanavir, indinavir, lopinavir, nelfinavir, tipranavir, ritonavir -St. John's wort -warfarin This list may not describe all possible interactions. Give your health care provider a list of all the medicines, herbs, non-prescription drugs, or dietary supplements you use. Also tell them if you smoke, drink alcohol, or use illegal drugs. Some items may interact with your medicine. What should I watch for while using this medicine? Visit your doctor or health care professional for regular check ups. See your doctor if you or your partner has sexual contact with others, becomes HIV positive, or gets a sexual transmitted disease. This product does not protect you against HIV infection (AIDS)  or other sexually transmitted diseases. You can check the placement of the IUD yourself by reaching up to the top of your vagina with clean fingers  to feel the threads. Do not pull on the threads. It is a good habit to check placement after each menstrual period. Call your doctor right away if you feel more of the IUD than just the threads or if you cannot feel the threads at all. The IUD may come out by itself. You may become pregnant if the device comes out. If you notice that the IUD has come out use a backup birth control method like condoms and call your health care provider. Using tampons will not change the position of the IUD and are okay to use during your period. What side effects may I notice from receiving this medicine? Side effects that you should report to your doctor or health care professional as soon as possible: -allergic reactions like skin rash, itching or hives, swelling of the face, lips, or tongue -fever, flu-like symptoms -genital sores -high blood pressure -no menstrual period for 6 weeks during use -pain, swelling, warmth in the leg -pelvic pain or tenderness -severe or sudden headache -signs of pregnancy -stomach cramping -sudden shortness of breath -trouble with balance, talking, or walking -unusual vaginal bleeding, discharge -yellowing of the eyes or skin Side effects that usually do not require medical attention (report to your doctor or health care professional if they continue or are bothersome): -acne -breast pain -change in sex drive or performance -changes in weight -cramping, dizziness, or faintness while the device is being inserted -headache -irregular menstrual bleeding within first 3 to 6 months of use -nausea This list may not describe all possible side effects. Call your doctor for medical advice about side effects. You may report side effects to FDA at 1-800-FDA-1088. Where should I keep my medicine? This does not apply. NOTE: This sheet is a summary. It may not cover all possible information. If you have questions about this medicine, talk to your doctor, pharmacist, or health care  provider.  2015, Elsevier/Gold Standard. (2011-08-12 13:54:04)

## 2014-10-24 NOTE — Progress Notes (Signed)
Patient ID: Shelley Payne, female   DOB: 16-Jul-1996, 19 y.o.   MRN: [id number]    Subjective: CC: skyla check HPI: Patient is a 19 y.o. female presenting to clinic today for skyla check. Concerns today include:  1. Dysmenorrhea Patient reports that as far as she knows the IUD has remained in place this time.  She reports that she had scant bleeding earlier in the month that lasted only a couple of days.  However, she began having a heavy period earlier this week.  She reports sharp cramping sensation.  She has taken Motrin 200mg  2-3 times daily that provides minimal relief.  Denies nausea, vomiting, debilitating pain.    2. T2DM Patient reports that 2/2 to insurance she still has not called to schedule with Dr Fransico Michaelbrennan for DM follow up.  She reports that she just found out that she has active medicaid and plans to call today/tomorrow for appt.  We discussed that she needs to be monitored closely, especially in the setting of poorly controlled DM.  She denies nausea, vomiting, vision changes, dizziness, polyuria or polydipsia.  Social History Reviewed: non smoker. FamHx and MedHx updated.  Please see EMR. Health Maintenance: Urine microalbumin due  ROS: All other systems reviewed and are negative.  Objective: Office vital signs reviewed. Ht 5\' 2"  (1.575 m)  Wt 231 lb 3.2 oz (104.872 kg)  BMI 42.28 kg/m2  LMP 10/06/2014  Physical Examination:  General: Awake, alert, obese female, NAD HEENT: Normal, EOMI GI: soft, NT/ND, no masses GU: normal appearing external vaginal mucosa with no lesions, normal vaginal wall, moderate amounts of bleeding from os, Skyla strings visualized and are of appropriate length MSK: Normal gait and station  Assessment: 19 y.o. female with dysmenorrhea, T2DM  Plan: See Problem List and After Visit Summary   Raliegh IpAshly M Gottschalk, DO PGY-1, [facility]

## 2014-10-24 NOTE — Assessment & Plan Note (Signed)
-  Patient instructed to schedule with Dr Fransico MichaelBrennan, endocrinologist ASAP  -Patient to schedule with me for microalbumin, A1c, DM management if insurance does not allow Dr Fransico MichaelBrennan. -Patient voiced good understanding

## 2014-10-24 NOTE — Assessment & Plan Note (Addendum)
Recent IUD placement.  Strings visualized.  Expect that symptoms will improve.   -Motrin 600mg  TID PRN pain -Patient to return if worsening symptoms.  May need to consider alternative to IUD if no improvement in symptoms after 6 months.   -Patient voices good understanding of plan.

## 2014-10-25 NOTE — Progress Notes (Signed)
One of the preceptor of the day. 

## 2014-11-13 ENCOUNTER — Encounter: Payer: Self-pay | Admitting: Family Medicine

## 2014-11-13 ENCOUNTER — Ambulatory Visit (INDEPENDENT_AMBULATORY_CARE_PROVIDER_SITE_OTHER): Payer: 59 | Admitting: Family Medicine

## 2014-11-13 VITALS — BP 122/83 | HR 82 | Temp 98.1°F | Ht 62.0 in | Wt 232.8 lb

## 2014-11-13 DIAGNOSIS — IMO0002 Reserved for concepts with insufficient information to code with codable children: Secondary | ICD-10-CM

## 2014-11-13 DIAGNOSIS — Z23 Encounter for immunization: Secondary | ICD-10-CM

## 2014-11-13 DIAGNOSIS — E1165 Type 2 diabetes mellitus with hyperglycemia: Secondary | ICD-10-CM

## 2014-11-13 LAB — POCT GLYCOSYLATED HEMOGLOBIN (HGB A1C): Hemoglobin A1C: 11.6

## 2014-11-13 MED ORDER — LIRAGLUTIDE 18 MG/3ML ~~LOC~~ SOPN: PEN_INJECTOR | SUBCUTANEOUS | Status: DC

## 2014-11-13 MED ORDER — METFORMIN HCL 500 MG PO TABS: 500.0000 mg | ORAL_TABLET | Freq: Two times a day (BID) | ORAL | Status: DC

## 2014-11-13 MED ORDER — SAXAGLIPTIN HCL 5 MG PO TABS: 5.0000 mg | ORAL_TABLET | Freq: Every day | ORAL | Status: DC

## 2014-11-13 NOTE — Assessment & Plan Note (Signed)
A1c 11.6.  Patient NOT compliant with medication or BG testing.  Concern that patient is not taking her disease seriously.  We discussed at length that she must be compliant with daily BG testing and medications, as she is at HIGH risk of stroke, heart attack, kidney failure and amputation if she continues to ignore her health.  Foot exam performed today. -Urine microalbumin today -RF Metformin, Onglyza, Victoza x3 months -Repeat A1c in 3 months at DM follow up -Will recommend initiating insulin if no improvement in glucose at next appointment -Patient voices understanding of plan and will schedule 3 month follow up

## 2014-11-13 NOTE — Patient Instructions (Signed)
It was a pleasure seeing you today, Shelley Payne!  Information regarding what we discussed is included in this packet.  Please make an appointment to see me in 3months. We will do your pap after your 19st birthday.  Please feel free to call our office at [phone] if any questions or concerns arise.  1. Call the office with what glucose monitor you use and let me know what strips 2. Let me know what blood pressure medicine is called 3. TAKE your medications!  These have been called into your pharmacy. 4. Monitor your sugars every day before breakfast  Warm Regards, Brigette Hopfer M. Demetrias Goodbar, DO  Blood Glucose Monitoring Monitoring your blood glucose (also know as blood sugar) helps you to manage your diabetes. It also helps you and your health care provider monitor your diabetes and determine how well your treatment plan is working. WHY SHOULD YOU MONITOR YOUR BLOOD GLUCOSE?  It can help you understand how food, exercise, and medicine affect your blood glucose.  It allows you to know what your blood glucose is at any given moment. You can quickly tell if you are having low blood glucose (hypoglycemia) or high blood glucose (hyperglycemia).  It can help you and your health care provider know how to adjust your medicines.  It can help you understand how to manage an illness or adjust medicine for exercise. WHEN SHOULD YOU TEST? Your health care provider will help you decide how often you should check your blood glucose. This may depend on the type of diabetes you have, your diabetes control, or the types of medicines you are taking. Be sure to write down all of your blood glucose readings so that this information can be reviewed with your health care provider. See below for examples of testing times that your health care provider may suggest. Type 1 Diabetes  Test 4 times a day if you are in good control, using an insulin pump, or perform multiple daily injections.  If your diabetes is not  well controlled or if you are sick, you may need to monitor more often.  It is a good idea to also monitor:  Before and after exercise.  Between meals and 2 hours after a meal.  Occasionally between 2:00 a.m. and 3:00 a.m. Type 2 Diabetes  It can vary with each person, but generally, if you are on insulin, test 4 times a day.  If you take medicines by mouth (orally), test 2 times a day.  If you are on a controlled diet, test once a day.  If your diabetes is not well controlled or if you are sick, you may need to monitor more often. HOW TO MONITOR YOUR BLOOD GLUCOSE Supplies Needed  Blood glucose meter.  Test strips for your meter. Each meter has its own strips. You must use the strips that go with your own meter.  A pricking needle (lancet).  A device that holds the lancet (lancing device).  A journal or log book to write down your results. Procedure  Wash your hands with soap and water. Alcohol is not preferred.  Prick the side of your finger (not the tip) with the lancet.  Gently milk the finger until a small drop of blood appears.  Follow the instructions that come with your meter for inserting the test strip, applying blood to the strip, and using your blood glucose meter. Other Areas to Get Blood for Testing Some meters allow you to use other areas of your body (other than  your finger) to test your blood. These areas are called alternative sites. The most common alternative sites are:  The forearm.  The thigh.  The back area of the lower leg.  The palm of the hand. The blood flow in these areas is slower. Therefore, the blood glucose values you get may be delayed, and the numbers are different from what you would get from your fingers. Do not use alternative sites if you think you are having hypoglycemia. Your reading will not be accurate. Always use a finger if you are having hypoglycemia. Also, if you cannot feel your lows (hypoglycemia unawareness), always use  your fingers for your blood glucose checks. ADDITIONAL TIPS FOR GLUCOSE MONITORING  Do not reuse lancets.  Always carry your supplies with you.  All blood glucose meters have a 24-hour "hotline" number to call if you have questions or need help.  Adjust (calibrate) your blood glucose meter with a control solution after finishing a few boxes of strips. BLOOD GLUCOSE RECORD KEEPING It is a good idea to keep a daily record or log of your blood glucose readings. Most glucose meters, if not all, keep your glucose records stored in the meter. Some meters come with the ability to download your records to your home computer. Keeping a record of your blood glucose readings is especially helpful if you are wanting to look for patterns. Make notes to go along with the blood glucose readings because you might forget what happened at that exact time. Keeping good records helps you and your health care provider to work together to achieve good diabetes management.  Document Released: 07/15/2003 Document Revised: 11/26/2013 Document Reviewed: 12/04/2012 [facility] Patient Information 2015 [place], [place]. This information is not intended to replace advice given to you by your health care provider. Make sure you discuss any questions you have with your health care provider.

## 2014-11-13 NOTE — Progress Notes (Signed)
Patient ID: Shelley Payne, female   DOB: July 13, 1996, 19 y.o.   MRN: [id number]    Subjective: CC:DM HPI: Patient is a 19 y.o. female presenting to clinic today for follow up. Concerns today include:  Diabetes, T2 uncontrolled High at home: 300 Low at home: 110's Taking medications: Metformin 500mg  once daily (prescribed BID) Side effects: none ROS: denies fever, chills, dizziness, LOC, polyuria, polydipsia, numbness or tingling in extremities or chest pain. Last eye exam: >1 year, but has an appointment with month Last foot exam: >1 year ago Nephropathy screen indicated?: will obtain microalbumin Last flu, zoster and/or pneumovax: UTD flu, pneumonia shot today  Patient reports that she is not compliant with her medications because she is afraid to feel bad on them.  She has not taken anything but Metformin in some time.  She monitors sugars "maybe once weekly".  She endorses polydipsia and polyuria.  Denies vision changes, sensation changes, foot ulcers, diarrhea, N/V.    Social History Reviewed: non smoker. FamHx and MedHx updated.  Please see EMR. Health Maintenance: pneumovax today  ROS: All other systems reviewed and are negative.  Objective: Office vital signs reviewed. BP 122/83 mmHg  Pulse 82  Temp(Src) 98.1 F (36.7 C) (Oral)  Ht 5\' 2"  (1.575 m)  Wt 232 lb 12.8 oz (105.597 kg)  BMI 42.57 kg/m2  SpO2 100%  LMP 11/08/2014  Physical Examination:  General: Awake, alert, obese female, NAD HEENT: Normal, EOMI GI: soft, NT/ND,+BS x4, no hepatomegaly, no splenomegaly Extremities: WWP, No edema, cyanosis or clubbing; +2 pulses bilaterally MSK: Normal gait and station Skin: dry, intact, no rashes; b/l feet with peeling skin.   Neuro: Strength and sensation grossly intact  Diabetic foot exam performed today.  See EMR  Assessment: 19 y.o. female with uncontrolled T2DM  Plan: See Problem List and After Visit Summary   Raliegh IpAshly M Boysie Bonebrake, DO PGY-1, [facility]

## 2014-11-14 LAB — MICROALBUMIN / CREATININE URINE RATIO
CREATININE, URINE: 215.4 mg/dL
Microalb Creat Ratio: 26.9 mg/g (ref 0.0–30.0)
Microalb, Ur: 5.8 mg/dL — ABNORMAL HIGH (ref <2.0)

## 2014-11-14 NOTE — Progress Notes (Signed)
I was preceptor the day of this visit.   

## 2014-11-15 ENCOUNTER — Telehealth: Payer: Self-pay | Admitting: Family Medicine

## 2014-11-15 ENCOUNTER — Encounter: Payer: Self-pay | Admitting: Family Medicine

## 2014-11-15 ENCOUNTER — Other Ambulatory Visit: Payer: Self-pay | Admitting: Family Medicine

## 2014-11-15 DIAGNOSIS — I10 Essential (primary) hypertension: Secondary | ICD-10-CM

## 2014-11-15 DIAGNOSIS — R809 Proteinuria, unspecified: Secondary | ICD-10-CM

## 2014-11-15 MED ORDER — LISINOPRIL 2.5 MG PO TABS: 2.5000 mg | ORAL_TABLET | Freq: Every day | ORAL | Status: DC

## 2014-11-15 NOTE — Telephone Encounter (Signed)
No answer.  Lisinopril 2.5mg  called into pharmacy and copy of results mailed to home.  Ashly M. Nadine CountsGottschalk, DO PGY-1, [facility]

## 2014-11-20 ENCOUNTER — Telehealth: Payer: Self-pay | Admitting: Family Medicine

## 2014-11-20 NOTE — Telephone Encounter (Signed)
Dr Claudie Fishermanhin at [facility]---needs a referral appt is tomorrow at 10:15 [phone]

## 2014-11-29 ENCOUNTER — Other Ambulatory Visit: Payer: Self-pay | Admitting: Family Medicine

## 2014-11-29 DIAGNOSIS — E1165 Type 2 diabetes mellitus with hyperglycemia: Secondary | ICD-10-CM

## 2014-11-29 DIAGNOSIS — IMO0002 Reserved for concepts with insufficient information to code with codable children: Secondary | ICD-10-CM

## 2014-11-29 NOTE — Telephone Encounter (Signed)
I have done this now.  However, please note that patient has never asked for a referral for opthal.  Also, I'm off today.  In the future, if something is urgent it may be worth asking an attending to care of the matter.

## 2014-11-29 NOTE — Telephone Encounter (Signed)
I have [facility] the [facility] for [facility].  This can take up to 2 days to be approved.  Unfortunately, I am not sure why this was not submitted on 11/20/14 when initially requested.  Message was not seen until 4pm today so there was no need to process urgently as appt was @ 11am.

## 2014-11-29 NOTE — Telephone Encounter (Signed)
Calling because they have not received the referral and the patient is being seen today (her appointment is "right now"), The doctor is Dr. Imogene Burnhen, at [facility] [phone]. Thank you, Dorothey BasemanSadie Reynolds, ASA

## 2014-11-29 NOTE — Telephone Encounter (Signed)
No referral in pts chart. Will forward to MD. Shelley Payne, Maryjo RochesterJessica Dawn

## 2014-11-30 NOTE — Telephone Encounter (Signed)
Thank you :)

## 2014-12-16 ENCOUNTER — Other Ambulatory Visit: Payer: Self-pay | Admitting: Family Medicine

## 2014-12-16 DIAGNOSIS — E1165 Type 2 diabetes mellitus with hyperglycemia: Secondary | ICD-10-CM

## 2014-12-16 DIAGNOSIS — IMO0002 Reserved for concepts with insufficient information to code with codable children: Secondary | ICD-10-CM

## 2014-12-16 MED ORDER — LIRAGLUTIDE 18 MG/3ML ~~LOC~~ SOPN: PEN_INJECTOR | SUBCUTANEOUS | Status: DC

## 2014-12-16 NOTE — Telephone Encounter (Signed)
Pt is requesting refill on victoza, pt goes to [facility]

## 2015-08-11 ENCOUNTER — Ambulatory Visit (INDEPENDENT_AMBULATORY_CARE_PROVIDER_SITE_OTHER): Payer: BLUE CROSS/BLUE SHIELD | Admitting: *Deleted

## 2015-08-11 DIAGNOSIS — Z111 Encounter for screening for respiratory tuberculosis: Secondary | ICD-10-CM | POA: Diagnosis not present

## 2015-08-11 NOTE — Progress Notes (Signed)
Tuberculin skin test applied to left ventral forearm. Explained how to read the test, measuring induration not just erythema; she will come into office in 48-72 hours to have test read. 

## 2015-08-14 ENCOUNTER — Ambulatory Visit (INDEPENDENT_AMBULATORY_CARE_PROVIDER_SITE_OTHER): Payer: BLUE CROSS/BLUE SHIELD | Admitting: *Deleted

## 2015-08-14 DIAGNOSIS — Z111 Encounter for screening for respiratory tuberculosis: Secondary | ICD-10-CM

## 2015-08-14 DIAGNOSIS — Z7689 Persons encountering health services in other specified circumstances: Secondary | ICD-10-CM

## 2015-08-14 LAB — TB SKIN TEST
INDURATION: 0 mm
TB SKIN TEST: NEGATIVE

## 2015-08-14 NOTE — Progress Notes (Signed)
   PPD Reading Note PPD read and results entered in Epic Result: 0 mm induration. Interpretation: Negative Allergic reaction: no Letter provided for school

## 2015-10-10 ENCOUNTER — Emergency Department (HOSPITAL_COMMUNITY): Payer: BLUE CROSS/BLUE SHIELD

## 2015-10-10 ENCOUNTER — Ambulatory Visit (INDEPENDENT_AMBULATORY_CARE_PROVIDER_SITE_OTHER): Payer: BLUE CROSS/BLUE SHIELD | Admitting: Family Medicine

## 2015-10-10 ENCOUNTER — Encounter (HOSPITAL_COMMUNITY): Payer: Self-pay | Admitting: *Deleted

## 2015-10-10 ENCOUNTER — Encounter: Payer: Self-pay | Admitting: Family Medicine

## 2015-10-10 ENCOUNTER — Emergency Department (HOSPITAL_COMMUNITY)
Admission: EM | Admit: 2015-10-10 | Discharge: 2015-10-11 | Disposition: A | Discharge status: Home / Self Care | Payer: BLUE CROSS/BLUE SHIELD | Attending: Emergency Medicine | Admitting: Emergency Medicine

## 2015-10-10 VITALS — BP 126/86 | HR 82 | Temp 97.8°F | Wt 228.0 lb

## 2015-10-10 DIAGNOSIS — J45909 Unspecified asthma, uncomplicated: Secondary | ICD-10-CM | POA: Insufficient documentation

## 2015-10-10 DIAGNOSIS — B3731 Acute candidiasis of vulva and vagina: Secondary | ICD-10-CM

## 2015-10-10 DIAGNOSIS — D849 Immunodeficiency, unspecified: Secondary | ICD-10-CM | POA: Diagnosis not present

## 2015-10-10 DIAGNOSIS — E1165 Type 2 diabetes mellitus with hyperglycemia: Secondary | ICD-10-CM | POA: Diagnosis not present

## 2015-10-10 DIAGNOSIS — B9689 Other specified bacterial agents as the cause of diseases classified elsewhere: Secondary | ICD-10-CM

## 2015-10-10 DIAGNOSIS — Y9389 Activity, other specified: Secondary | ICD-10-CM | POA: Diagnosis not present

## 2015-10-10 DIAGNOSIS — T8389XA Other specified complication of genitourinary prosthetic devices, implants and grafts, initial encounter: Secondary | ICD-10-CM

## 2015-10-10 DIAGNOSIS — E669 Obesity, unspecified: Secondary | ICD-10-CM | POA: Insufficient documentation

## 2015-10-10 DIAGNOSIS — R1031 Right lower quadrant pain: Secondary | ICD-10-CM | POA: Diagnosis present

## 2015-10-10 DIAGNOSIS — Z79899 Other long term (current) drug therapy: Secondary | ICD-10-CM | POA: Insufficient documentation

## 2015-10-10 DIAGNOSIS — N760 Acute vaginitis: Secondary | ICD-10-CM | POA: Diagnosis not present

## 2015-10-10 DIAGNOSIS — E039 Hypothyroidism, unspecified: Secondary | ICD-10-CM | POA: Diagnosis not present

## 2015-10-10 DIAGNOSIS — K353 Acute appendicitis with localized peritonitis, without perforation or gangrene: Secondary | ICD-10-CM

## 2015-10-10 DIAGNOSIS — Z872 Personal history of diseases of the skin and subcutaneous tissue: Secondary | ICD-10-CM | POA: Insufficient documentation

## 2015-10-10 DIAGNOSIS — I10 Essential (primary) hypertension: Secondary | ICD-10-CM | POA: Insufficient documentation

## 2015-10-10 DIAGNOSIS — B373 Candidiasis of vulva and vagina: Secondary | ICD-10-CM | POA: Insufficient documentation

## 2015-10-10 DIAGNOSIS — Y828 Other medical devices associated with adverse incidents: Secondary | ICD-10-CM | POA: Diagnosis not present

## 2015-10-10 DIAGNOSIS — Z7984 Long term (current) use of oral hypoglycemic drugs: Secondary | ICD-10-CM | POA: Diagnosis not present

## 2015-10-10 DIAGNOSIS — T8332XA Displacement of intrauterine contraceptive device, initial encounter: Secondary | ICD-10-CM

## 2015-10-10 DIAGNOSIS — R110 Nausea: Secondary | ICD-10-CM

## 2015-10-10 DIAGNOSIS — Z3202 Encounter for pregnancy test, result negative: Secondary | ICD-10-CM | POA: Diagnosis not present

## 2015-10-10 LAB — COMPREHENSIVE METABOLIC PANEL WITH GFR
ALT: 52 U/L (ref 14–54)
AST: 39 U/L (ref 15–41)
Albumin: 3.8 g/dL (ref 3.5–5.0)
Alkaline Phosphatase: 78 U/L (ref 38–126)
Anion gap: 10 (ref 5–15)
BUN: 8 mg/dL (ref 6–20)
CO2: 27 mmol/L (ref 22–32)
Calcium: 9.7 mg/dL (ref 8.9–10.3)
Chloride: 102 mmol/L (ref 101–111)
Creatinine, Ser: 0.62 mg/dL (ref 0.44–1.00)
GFR calc Af Amer: >60 mL/min
GFR calc non Af Amer: >60 mL/min
Glucose, Bld: 303 mg/dL — ABNORMAL HIGH (ref 65–99)
Potassium: 4 mmol/L (ref 3.5–5.1)
Sodium: 139 mmol/L (ref 135–145)
Total Bilirubin: 0.2 mg/dL — ABNORMAL LOW (ref 0.3–1.2)
Total Protein: 7.3 g/dL (ref 6.5–8.1)

## 2015-10-10 LAB — WET PREP, GENITAL
Sperm: NONE SEEN
Trich, Wet Prep: NONE SEEN

## 2015-10-10 LAB — URINE MICROSCOPIC-ADD ON

## 2015-10-10 LAB — CBC
HCT: 40.4 % (ref 36.0–46.0)
Hemoglobin: 12.8 g/dL (ref 12.0–15.0)
MCH: 25.3 pg — ABNORMAL LOW (ref 26.0–34.0)
MCHC: 31.7 g/dL (ref 30.0–36.0)
MCV: 80 fL (ref 78.0–100.0)
Platelets: 286 10*3/uL (ref 150–400)
RBC: 5.05 MIL/uL (ref 3.87–5.11)
RDW: 13.9 % (ref 11.5–15.5)
WBC: 10.5 10*3/uL (ref 4.0–10.5)

## 2015-10-10 LAB — URINALYSIS, ROUTINE W REFLEX MICROSCOPIC
Bilirubin Urine: NEGATIVE
Glucose, UA: >1000 mg/dL — AB
Ketones, ur: 15 mg/dL — AB
Leukocytes, UA: NEGATIVE
Nitrite: NEGATIVE
Protein, ur: NEGATIVE mg/dL
Specific Gravity, Urine: 1.029 (ref 1.005–1.030)
pH: 5 (ref 5.0–8.0)

## 2015-10-10 LAB — LIPASE, BLOOD: Lipase: 37 U/L (ref 11–51)

## 2015-10-10 LAB — I-STAT BETA HCG BLOOD, ED (MC, WL, AP ONLY): I-stat hCG, quantitative: <5 m[IU]/mL (ref <5)

## 2015-10-10 MED ORDER — NAPROXEN 500 MG PO TABS: 500.0000 mg | ORAL_TABLET | Freq: Two times a day (BID) | ORAL | Status: DC | PRN

## 2015-10-10 MED ORDER — MORPHINE SULFATE (PF) 4 MG/ML IV SOLN
4.0000 mg | Freq: Once | INTRAVENOUS | Status: AC
  Administered 2015-10-10: 4 mg via INTRAVENOUS
  Filled 2015-10-10: qty 1

## 2015-10-10 MED ORDER — FLUCONAZOLE 150 MG PO TABS: 150.0000 mg | ORAL_TABLET | Freq: Once | ORAL | Status: DC

## 2015-10-10 MED ORDER — SODIUM CHLORIDE 0.9 % IV BOLUS (SEPSIS)
1000.0000 mL | Freq: Once | INTRAVENOUS | Status: AC
  Administered 2015-10-10: 1000 mL via INTRAVENOUS

## 2015-10-10 MED ORDER — HYDROCODONE-ACETAMINOPHEN 5-325 MG PO TABS: 1.0000 | ORAL_TABLET | Freq: Four times a day (QID) | ORAL | Status: DC | PRN

## 2015-10-10 MED ORDER — ONDANSETRON HCL 4 MG/2ML IJ SOLN
4.0000 mg | Freq: Once | INTRAMUSCULAR | Status: AC
  Administered 2015-10-10: 4 mg via INTRAVENOUS
  Filled 2015-10-10: qty 2

## 2015-10-10 MED ORDER — ONDANSETRON 4 MG PO TBDP: 4.0000 mg | ORAL_TABLET | Freq: Three times a day (TID) | ORAL | Status: DC | PRN

## 2015-10-10 MED ORDER — IOHEXOL 300 MG/ML  SOLN
100.0000 mL | Freq: Once | INTRAMUSCULAR | Status: AC | PRN
  Administered 2015-10-10: 100 mL via INTRAVENOUS

## 2015-10-10 MED ORDER — METRONIDAZOLE 500 MG PO TABS: 500.0000 mg | ORAL_TABLET | Freq: Two times a day (BID) | ORAL | Status: DC

## 2015-10-10 NOTE — ED Notes (Signed)
Patient transported to CT 

## 2015-10-10 NOTE — ED Provider Notes (Signed)
CSN: [id number]     Arrival date & time 10/10/15  1443 History   First MD Initiated Contact with Patient 10/10/15 1836     Chief Complaint  Patient presents with  . Abdominal Pain     (Consider location/radiation/quality/duration/timing/severity/associated sxs/prior Treatment) HPI Comments: Shelley Payne is a 20 y.o. female with a PMHx of DM2, asthma, obesity, autoimmune thyroiditis, HTN, and acanthosis nigricans, who presents to the ED with complaints of gradual onset right lower quadrant abdominal pain 4 days which is gradually worsening. She was seen at her family physician's office at family practice residency program and was sent here to rule out appendicitis. She describes the pain as 10/10 constant sharp right lower quadrant pain radiating into the periumbilical region, worse with sitting, improved with laying down, and unrelieved with Tylenol. Associated symptoms include nausea. She is sexually active with one female partner in the last year, protected with condoms. She does have an IUD in place.   She denies any fevers, chills, chest pain, shortness breath, vomiting, diarrhea, constipation, obstipation, melena, hematochezia, dysuria, hematuria, vaginal bleeding or discharge, numbness, tingling, weakness, recent travel, sick contacts, suspicious food intake, alcohol use, or chronic NSAID use. No prior abd surgeries.  Patient is a 20 y.o. female presenting with abdominal pain. The history is provided by the patient and medical records. No language interpreter was used.  Abdominal Pain Pain location:  RLQ Pain quality: sharp   Pain radiates to:  Periumbilical region Pain severity:  Severe Onset quality:  Gradual Duration:  4 days Timing:  Constant Progression:  Worsening Chronicity:  New Context: not recent travel, not sick contacts and not suspicious food intake   Relieved by:  Lying down Worsened by:  Position changes Ineffective treatments:  Acetaminophen Associated symptoms:  nausea   Associated symptoms: no chest pain, no chills, no constipation, no diarrhea, no dysuria, no fever, no flatus, no hematemesis, no hematochezia, no hematuria, no melena, no shortness of breath, no vaginal bleeding, no vaginal discharge and no vomiting   Risk factors: no alcohol abuse, has not had multiple surgeries and no NSAID use     Past Medical History  Diagnosis Date  . Asthma   . Environmental allergies   . Obesity   . Thyroiditis, autoimmune   . Hypothyroidism, acquired, autoimmune   . Diabetes mellitus, type II (HCC)   . Acanthosis nigricans, acquired   . Hypertension    Past Surgical History  Procedure Laterality Date  . None     Family History  Problem Relation Age of Onset  . Obesity Mother   . Obesity Maternal Aunt   . Asthma Maternal Aunt   . Hypertension Maternal Aunt   . Diabetes Maternal Grandfather   . Hypertension Maternal Grandfather   . Hyperlipidemia Maternal Grandmother    Social History  Substance Use Topics  . Smoking status: Never Smoker   . Smokeless tobacco: Never Used  . Alcohol Use: No   OB History    No data available     Review of Systems  Constitutional: Negative for fever and chills.  Respiratory: Negative for shortness of breath.   Cardiovascular: Negative for chest pain.  Gastrointestinal: Positive for nausea and abdominal pain. Negative for vomiting, diarrhea, constipation, blood in stool, melena, hematochezia, flatus and hematemesis.  Genitourinary: Negative for dysuria, hematuria, vaginal bleeding and vaginal discharge.  Musculoskeletal: Negative for myalgias and arthralgias.  Skin: Negative for color change.  Allergic/Immunologic: Positive for immunocompromised state (diabetic).  Neurological: Negative for weakness and numbness.  Psychiatric/Behavioral: Negative for confusion.   10 Systems reviewed and are negative for acute change except as noted in the HPI.    Allergies  Review of patient's allergies indicates no  known allergies.  Home Medications   Prior to Admission medications   Medication Sig Start Date End Date Taking? Authorizing Provider  ibuprofen (ADVIL,MOTRIN) 600 MG tablet Take 1 tablet (600 mg total) by mouth every 8 (eight) hours as needed. 10/24/14   Raliegh Ip, DO  levothyroxine (SYNTHROID, LEVOTHROID) 50 MCG tablet Take 1 tablet (50 mcg total) by mouth daily before breakfast. 07/01/14 07/01/15  Raliegh Ip, DO  Liraglutide (VICTOZA) 18 MG/3ML SOPN Inject 1.2mg  sub-q daily. 12/16/14   Ashly Hulen Skains, DO  lisinopril (ZESTRIL) 2.5 MG tablet Take 1 tablet (2.5 mg total) by mouth daily. 11/15/14   Ashly Hulen Skains, DO  metFORMIN (GLUCOPHAGE) 500 MG tablet Take 1 tablet (500 mg total) by mouth 2 (two) times daily with a meal. 11/13/14   Ashly Hulen Skains, DO  saxagliptin HCl (ONGLYZA) 5 MG TABS tablet Take 1 tablet (5 mg total) by mouth daily. 11/13/14   Ashly M Gottschalk, DO   BP 126/97 mmHg  Pulse 100  Temp(Src) 98.8 F (37.1 C) (Oral)  Resp 18  Ht  (1.575 m)  Wt 103.562 kg  BMI 41.75 kg/m2  SpO2 99%  LMP 09/02/2015 Physical Exam  Constitutional: She is oriented to person, place, and time. Vital signs are normal. She appears well-developed and well-nourished.  Non-toxic appearance. No distress.  Afebrile, nontoxic, NAD  HENT:  Head: Normocephalic and atraumatic.  Mouth/Throat: Oropharynx is clear and moist and mucous membranes are normal.  Eyes: Conjunctivae and EOM are normal. Right eye exhibits no discharge. Left eye exhibits no discharge.  Neck: Normal range of motion. Neck supple.  Cardiovascular: Normal rate, regular rhythm, normal heart sounds and intact distal pulses.  Exam reveals no gallop and no friction rub.   No murmur heard. Pulmonary/Chest: Effort normal and breath sounds normal. No respiratory distress. She has no decreased breath sounds. She has no wheezes. She has no rhonchi. She has no rales.  Abdominal: Soft. Normal appearance and bowel  sounds are normal. She exhibits no distension. There is tenderness in the right lower quadrant. There is tenderness at McBurney's point. There is no rigidity, no rebound, no guarding, no CVA tenderness and negative Murphy's sign.    Soft, obese but nondistended, +BS throughout, with RLQ TTP near mcburney's point, no r/g/r, neg murphy's, neg psoas sign, neg foot tap test, neg rovsing's sign, no CVA TTP   Genitourinary: Uterus normal. Pelvic exam was performed with patient supine. There is no rash, tenderness or lesion on the right labia. There is no rash, tenderness or lesion on the left labia. Cervix exhibits no motion tenderness, no discharge and no friability. Right adnexum displays tenderness. Right adnexum displays no mass and no fullness. Left adnexum displays no mass, no tenderness and no fullness. No erythema or bleeding in the vagina. Vaginal discharge (scant physiologic) found.  Chaperone present for exam. No rashes, lesions, or tenderness to external genitalia. No erythema, injury, or tenderness to vaginal mucosa. Scant clear physiologic vaginal discharge without bleeding within vaginal vault. No adnexal masses or fullness, with minimal R adnexal tenderness. No CMT, cervical friability, or discharge from cervical os. Cervical os is closed with IUD strings in place. Uterus non-deviated, mobile, nonTTP, and without enlargement.    Musculoskeletal: Normal range of motion.  Neurological: She is alert and oriented  to person, place, and time. She has normal strength. No sensory deficit.  Skin: Skin is warm, dry and intact. No rash noted.  Psychiatric: She has a normal mood and affect.  Nursing note and vitals reviewed.   ED Course  Procedures (including critical care time) Labs Review Labs Reviewed  WET PREP, GENITAL - Abnormal; Notable for the following:    Yeast Wet Prep HPF POC PRESENT (*)    Clue Cells Wet Prep HPF POC PRESENT (*)    WBC, Wet Prep HPF POC MANY (*)    All other  components within normal limits  COMPREHENSIVE METABOLIC PANEL - Abnormal; Notable for the following:    Glucose, Bld 303 (*)    Total Bilirubin 0.2 (*)    All other components within normal limits  CBC - Abnormal; Notable for the following:    MCH 25.3 (*)    All other components within normal limits  URINALYSIS, ROUTINE W REFLEX MICROSCOPIC (NOT AT [facility]) - Abnormal; Notable for the following:    APPearance CLOUDY (*)    Glucose, UA >1000 (*)    Hgb urine dipstick TRACE (*)    Ketones, ur 15 (*)    All other components within normal limits  URINE MICROSCOPIC-ADD ON - Abnormal; Notable for the following:    Squamous Epithelial / LPF 0-5 (*)    Bacteria, UA MANY (*)    All other components within normal limits  URINE CULTURE  LIPASE, BLOOD  RPR  HIV ANTIBODY (ROUTINE TESTING)  I-STAT BETA HCG BLOOD, ED ([facility], [facility], [facility] ONLY)  GC/CHLAMYDIA PROBE AMP ([place]) NOT AT [facility]    Imaging Review [place] Transvaginal Non-ob  10/10/2015  CLINICAL DATA:  Right lower quadrant pain for 4 days. EXAM: TRANSABDOMINAL AND TRANSVAGINAL ULTRASOUND OF PELVIS DOPPLER ULTRASOUND OF OVARIES TECHNIQUE: Both transabdominal and transvaginal ultrasound examinations of the pelvis were performed. Transabdominal technique was performed for global imaging of the pelvis including uterus, ovaries, adnexal regions, and pelvic cul-de-sac. It was necessary to proceed with endovaginal exam following the transabdominal exam to visualize the ovaries. Color and duplex Doppler ultrasound was utilized to evaluate blood flow to the ovaries. COMPARISON:  CT 10/10/2015 FINDINGS: Uterus Measurements: 8.2 x 3.9 x 4.87. No myometrial masses. The IUD is abnormal in position, extending down into the endocervical canal. Endometrium Thickness: 8 mm.  No focal abnormality visualized. Right ovary Measurements: 1.9 x 1.8 x 2.8 cm. Normal appearance/no adnexal mass. Left ovary Measurements: 3.0 x 2.9 x 3.3 cm. Mildly complex partially collapsed 2.3  cm cyst or follicle, likely physiologic. Pulsed Doppler evaluation of both ovaries demonstrates normal low-resistance arterial and venous waveforms. Other findings No abnormal free fluid. IMPRESSION: Abnormal position of the IUD, extending into the endocervical canal. Electronically Signed   By: Ellery Plunkaniel R Mitchell M.D.   On: 10/10/2015 22:47   [place] Pelvis Complete  10/10/2015  CLINICAL DATA:  Right lower quadrant pain for 4 days. EXAM: TRANSABDOMINAL AND TRANSVAGINAL ULTRASOUND OF PELVIS DOPPLER ULTRASOUND OF OVARIES TECHNIQUE: Both transabdominal and transvaginal ultrasound examinations of the pelvis were performed. Transabdominal technique was performed for global imaging of the pelvis including uterus, ovaries, adnexal regions, and pelvic cul-de-sac. It was necessary to proceed with endovaginal exam following the transabdominal exam to visualize the ovaries. Color and duplex Doppler ultrasound was utilized to evaluate blood flow to the ovaries. COMPARISON:  CT 10/10/2015 FINDINGS: Uterus Measurements: 8.2 x 3.9 x 4.87. No myometrial masses. The IUD is abnormal in position, extending down into the endocervical canal.  Endometrium Thickness: 8 mm.  No focal abnormality visualized. Right ovary Measurements: 1.9 x 1.8 x 2.8 cm. Normal appearance/no adnexal mass. Left ovary Measurements: 3.0 x 2.9 x 3.3 cm. Mildly complex partially collapsed 2.3 cm cyst or follicle, likely physiologic. Pulsed Doppler evaluation of both ovaries demonstrates normal low-resistance arterial and venous waveforms. Other findings No abnormal free fluid. IMPRESSION: Abnormal position of the IUD, extending into the endocervical canal. Electronically Signed   By: Ellery Plunk M.D.   On: 10/10/2015 22:47   Ct Abdomen Pelvis W Contrast  10/10/2015  CLINICAL DATA:  Acute onset of right lower quadrant abdominal pain. Initial encounter. EXAM: CT ABDOMEN AND PELVIS WITH CONTRAST TECHNIQUE: Multidetector CT imaging of the abdomen and pelvis was  performed using the standard protocol following bolus administration of intravenous contrast. CONTRAST:  OMNIPAQUE IOHEXOL 300 MG/ML  SOLN COMPARISON:  None. FINDINGS: The visualized lung bases are clear. The liver and spleen are unremarkable in appearance. The gallbladder is within normal limits. The pancreas and adrenal glands are unremarkable. The kidneys are unremarkable in appearance. There is no evidence of hydronephrosis. No renal or ureteral stones are seen. No perinephric stranding is appreciated. No free fluid is identified. The small bowel is unremarkable in appearance. The stomach is within normal limits. No acute vascular abnormalities are seen. The appendix is normal in caliber, without evidence of appendicitis. The colon is unremarkable in appearance. The bladder is mildly distended and grossly unremarkable. The patient's intrauterine device is noted in abnormal position, seen at the cervix, with one of the prongs extending into the myometrium of the lower uterine segment. The uterus is otherwise grossly unremarkable. The ovaries are grossly symmetric. No suspicious adnexal masses are seen. No inguinal lymphadenopathy is seen. No acute osseous abnormalities are identified. IMPRESSION: 1. Intrauterine device noted in abnormal position, situated at the cervix, with one of the prongs extending into the myometrial wall of the lower uterine segment. This may explain the patient's symptoms. OB/GYN consultation is recommended. 2. Otherwise unremarkable contrast-enhanced CT of the abdomen and pelvis. Electronically Signed   By: Roanna Raider M.D.   On: 10/10/2015 20:47   [place] Art/ven Flow Abd Pelv Doppler  10/10/2015  CLINICAL DATA:  Right lower quadrant pain for 4 days. EXAM: TRANSABDOMINAL AND TRANSVAGINAL ULTRASOUND OF PELVIS DOPPLER ULTRASOUND OF OVARIES TECHNIQUE: Both transabdominal and transvaginal ultrasound examinations of the pelvis were performed. Transabdominal technique was performed for  global imaging of the pelvis including uterus, ovaries, adnexal regions, and pelvic cul-de-sac. It was necessary to proceed with endovaginal exam following the transabdominal exam to visualize the ovaries. Color and duplex Doppler ultrasound was utilized to evaluate blood flow to the ovaries. COMPARISON:  CT 10/10/2015 FINDINGS: Uterus Measurements: 8.2 x 3.9 x 4.87. No myometrial masses. The IUD is abnormal in position, extending down into the endocervical canal. Endometrium Thickness: 8 mm.  No focal abnormality visualized. Right ovary Measurements: 1.9 x 1.8 x 2.8 cm. Normal appearance/no adnexal mass. Left ovary Measurements: 3.0 x 2.9 x 3.3 cm. Mildly complex partially collapsed 2.3 cm cyst or follicle, likely physiologic. Pulsed Doppler evaluation of both ovaries demonstrates normal low-resistance arterial and venous waveforms. Other findings No abnormal free fluid. IMPRESSION: Abnormal position of the IUD, extending into the endocervical canal. Electronically Signed   By: Ellery Plunk M.D.   On: 10/10/2015 22:47   I have personally reviewed and evaluated these images and lab results as part of my medical decision-making.   EKG Interpretation None  MDM   Final diagnoses:  RLQ abdominal pain  Nausea  BV (bacterial vaginosis)  Yeast vaginitis  Type 2 diabetes mellitus with hyperglycemia, without long-term current use of insulin (HCC)  IUD migration, initial encounter [facility])    20 y.o. female here with RLQ abd pain x4 days, gradually worsening, sent by [facility] to r/o appendicitis. On exam, moderate RLQ TTP without rigidity or guarding, neg rovsing's and psoas sign on my exam, no rebound tenderness. Labs reveal U/A with nitrite/leuk neg, 0-5 squamous, 6-30 WBC, and many bacteria-- could be contaminated, pt without urinary symptoms, doubt UTI, but will send for culture. Lipase WNL, CMP with gluc 303 without anion gap. CBC unremarkable which is reassuring. Istat HCG was marked as  completed but minilab has no collection down there, will make sure these get collected now with the IV start-- if unable to get this done, will add-on Upreg for lab to perform. Will get STD check with pelvic exam. Will give fluids, nausea meds, pain meds, and perform pelvic exam. Given the RLQ tenderness, and concern for appendicitis (although unlikely given no leukocytosis, no fevers/vomiting, and symptoms ongoing x4 days) will proceed with abd CT as well, but will perform pelvic in the meantime and see if we need pelvic U/S as well to r/o other etiologies like torsion/PID/TOA/ovarian cysts/etc. Will reassess shortly  7:42 PM Pain improved, nausea improved. Pelvic exam reveals scant clear discharge likely physiologic, mild R adnexal tenderness without fullness; no CMT, IUD strings in place. Still waiting on [facility], will ensure this is done ASAP. Still hasn't gone for CT. If CT shows nothing, may consider pelvic U/S to r/o other ovarian/pelvic organ etiologies. Will reassess shortly  9:07 PM Pt still feeling better. Beta HCG neg. Wet prep showing yeast and BV, will treat for these at discharge. Ct showing normal appendix, shows IUD in abnormal position with one prong extending into myometrium. Discussed case with Dr. Wallace Keller of OBGYN faculty practice who does not feel she needs urgent U/S, could have this as an outpt when she follows up, and does not feel that the abnormal IUD placement is anything that needs to be urgently managed. When I discussed with the patient that we don't need to urgently do an U/S, she preferred to do one today, which would also serve to r/o ovarian torsion (although unlikely given that we're 4 days into symptoms, but still a possibility). Will proceed with pelvic U/S now, then reassess after.  11:23 PM Symptoms remain improved. U/S reveals IUD mispositioning again, but no other acute findings. No torsion. Doubt other concerning etiology, this could likely be the cause of her  symptoms. Discussed pain meds, nausea meds, and flagyl/diflucan for BV/yeast. F/up with PCP in 5-7 days for recheck and ongoing management. I explained the diagnosis and have given explicit precautions to return to the ER including for any other new or worsening symptoms. The patient understands and accepts the medical plan as it's been dictated and I have answered their questions. Discharge instructions concerning home care and prescriptions have been given. The patient is STABLE and is discharged to home in good condition.  BP 118/80 mmHg  Pulse 88  Temp(Src) 98.8 F (37.1 C) (Oral)  Resp 16  Ht  (1.575 m)  Wt 103.562 kg  BMI 41.75 kg/m2  SpO2 100%  LMP 09/02/2015  Meds ordered this encounter  Medications  . sodium chloride 0.9 % bolus 1,000 mL    Sig:   . morphine 4 MG/ML injection  4 mg    Sig:   . ondansetron (ZOFRAN) injection 4 mg    Sig:   . iohexol (OMNIPAQUE) 300 MG/ML solution 100 mL    Sig:   . HYDROcodone-acetaminophen (NORCO) 5-325 MG tablet    Sig: Take 1-2 tablets by mouth every 6 (six) hours as needed for severe pain.    Dispense:  6 tablet    Refill:  0    Order Specific Question:  Supervising Provider    Answer:  MILLER, BRIAN [[id number]]  . naproxen (NAPROSYN) 500 MG tablet    Sig: Take 1 tablet (500 mg total) by mouth 2 (two) times daily as needed for mild pain, moderate pain or headache (TAKE WITH MEALS.).    Dispense:  20 tablet    Refill:  0    Order Specific Question:  Supervising Provider    Answer:  MILLER, BRIAN [[id number]]  . ondansetron (ZOFRAN ODT) 4 MG disintegrating tablet    Sig: Take 1 tablet (4 mg total) by mouth every 8 (eight) hours as needed for nausea or vomiting.    Dispense:  15 tablet    Refill:  0    Order Specific Question:  Supervising Provider    Answer:  MILLER, BRIAN [[id number]]  . metroNIDAZOLE (FLAGYL) 500 MG tablet    Sig: Take 1 tablet (500 mg total) by mouth 2 (two) times daily. One po bid x 7 days    Dispense:  14 tablet     Refill:  0    Order Specific Question:  Supervising Provider    Answer:  Hyacinth Meeker, BRIAN [[id number]]  . fluconazole (DIFLUCAN) 150 MG tablet    Sig: Take 1 tablet (150 mg total) by mouth once. Take 1 tablet PO once on day 1, then once again on day 4    Dispense:  2 tablet    Refill:  0    Order Specific Question:  Supervising Provider    Answer:  MILLER, BRIAN [[id number]]  . morphine 4 MG/ML injection 4 mg    Sig:      Shelley Whitsell Camprubi-Soms, PA-C 10/10/15 2324  Raeford Razor, MD 10/11/15 1610

## 2015-10-10 NOTE — ED Notes (Signed)
Pt back to room A11 from [place]

## 2015-10-10 NOTE — ED Notes (Signed)
Patient transported to Ultrasound 

## 2015-10-10 NOTE — Discharge Instructions (Signed)
Your labs and imaging were reassuring today. Your pelvic exam did show a yeast infection and bacterial vaginosis, take diflucan and flagyl as directed for this, do not drink alcohol while taking these medications. Your CT scan showed that the IUD is positioned into the wall of the uterus, you will need to follow up with your primary care doctor or your OBGYN who will discuss options for this issue, but this is not an emergency at this time. Your ultrasound shows the IUD abnormal positioning but no other concerning findings. You were tested for gonorrhea, chlamydia, HIV, and syphilis, but the lab will call you if any of these are positive. Do not engage in sexual activity until you find out about these results. Use naprosyn and norco as directed as needed for pain but don't drive while taking norco. Use zofran as needed for nausea. Stay well hydrated. Follow up with your regular doctor in 5-7 days for recheck of symptoms and ongoing management of your IUD positioning. Return to the ER for changes or worsening symptoms.  Abdominal (belly) pain can be caused by many things. Your caregiver performed an examination and possibly ordered blood/urine tests and imaging (CT scan, x-rays, ultrasound). Many cases can be observed and treated at home after initial evaluation in the emergency department. Even though you are being discharged home, abdominal pain can be unpredictable. Therefore, you need a repeated exam if your pain does not resolve, returns, or worsens. Most patients with abdominal pain don't have to be admitted to the hospital or have surgery, but serious problems like appendicitis and gallbladder attacks can start out as nonspecific pain. Many abdominal conditions cannot be diagnosed in one visit, so follow-up evaluations are very important. SEEK IMMEDIATE MEDICAL ATTENTION IF YOU DEVELOP ANY OF THE FOLLOWING SYMPTOMS:  The pain does not go away or becomes severe.   A temperature above 101 develops.    Repeated vomiting occurs (multiple episodes).   The pain becomes localized to portions of the abdomen. The right side could possibly be appendicitis. In an adult, the left lower portion of the abdomen could be colitis or diverticulitis.   Blood is being passed in stools or vomit (bright red or black tarry stools).   Return also if you develop chest pain, difficulty breathing, dizziness or fainting, or become confused, poorly responsive, or inconsolable (young children).  The constipation stays for more than 4 days.   There is belly (abdominal) or rectal pain.   You do not seem to be getting better.     Abdominal Pain, Adult Many things can cause belly (abdominal) pain. Most times, the belly pain is not dangerous. Many cases of belly pain can be watched and treated at home. HOME CARE   Do not take medicines that help you go poop (laxatives) unless told to by your doctor.  Only take medicine as told by your doctor.  Eat or drink as told by your doctor. Your doctor will tell you if you should be on a special diet. GET HELP IF:  You do not know what is causing your belly pain.  You have belly pain while you are sick to your stomach (nauseous) or have runny poop (diarrhea).  You have pain while you pee or poop.  Your belly pain wakes you up at night.  You have belly pain that gets worse or better when you eat.  You have belly pain that gets worse when you eat fatty foods.  You have a fever. GET HELP RIGHT AWAY  IF:   The pain does not go away within 2 hours.  You keep throwing up (vomiting).  The pain changes and is only in the right or left part of the belly.  You have bloody or tarry looking poop. MAKE SURE YOU:   Understand these instructions.  Will watch your condition.  Will get help right away if you are not doing well or get worse.   This information is not intended to replace advice given to you by your health care provider. Make sure you discuss any  questions you have with your health care provider.   Document Released: 12/29/2007 Document Revised: 08/02/2014 Document Reviewed: 03/21/2013 Elsevier Interactive Patient Education 2016 Elsevier Inc.  Bacterial Vaginosis Bacterial vaginosis is an infection of the vagina. It happens when too many germs (bacteria) grow in the vagina. Having this infection puts you at risk for getting other infections from sex. Treating this infection can help lower your risk for other infections, such as:   Chlamydia.  Gonorrhea.  HIV.  Herpes. HOME CARE  Take your medicine as told by your doctor.  Finish your medicine even if you start to feel better.  Tell your sex partner that you have an infection. They should see their doctor for treatment.  During treatment:  Avoid sex or use condoms correctly.  Do not douche.  Do not drink alcohol unless your doctor tells you it is ok.  Do not breastfeed unless your doctor tells you it is ok. GET HELP IF:  You are not getting better after 3 days of treatment.  You have more grey fluid (discharge) coming from your vagina than before.  You have more pain than before.  You have a fever. MAKE SURE YOU:   Understand these instructions.  Will watch your condition.  Will get help right away if you are not doing well or get worse.   This information is not intended to replace advice given to you by your health care provider. Make sure you discuss any questions you have with your health care provider.   Document Released: 04/20/2008 Document Revised: 08/02/2014 Document Reviewed: 02/21/2013 Elsevier Interactive Patient Education 2016 Elsevier Inc.  Nausea, Adult Nausea means you feel sick to your stomach or need to throw up (vomit). It may be a sign of a more serious problem. If nausea gets worse, you may throw up. If you throw up a lot, you may lose too much body fluid (dehydration). HOME CARE   Get plenty of rest.  Ask your doctor how to  replace body fluid losses (rehydrate).  Eat small amounts of food. Sip liquids more often.  Take all medicines as told by your doctor. GET HELP RIGHT AWAY IF:  You have a fever.  You pass out (faint).  You keep throwing up or have blood in your throw up.  You are very weak, have dry lips or a dry mouth, or you are very thirsty (dehydrated).  You have dark or bloody poop (stool).  You have very bad chest or belly (abdominal) pain.  You do not get better after 2 days, or you get worse.  You have a headache. MAKE SURE YOU:  Understand these instructions.  Will watch your condition.  Will get help right away if you are not doing well or get worse.   This information is not intended to replace advice given to you by your health care provider. Make sure you discuss any questions you have with your health care provider.  Document Released: 07/01/2011 Document Revised: 10/04/2011 Document Reviewed: 07/01/2011 Elsevier Interactive Patient Education 2016 Elsevier Inc.   Monilial Vaginitis Vaginitis in a soreness, swelling and redness (inflammation) of the vagina and vulva. Monilial vaginitis is not a sexually transmitted infection. CAUSES  Yeast vaginitis is caused by yeast (candida) that is normally found in your vagina. With a yeast infection, the candida has overgrown in number to a point that upsets the chemical balance. SYMPTOMS   White, thick vaginal discharge.  Swelling, itching, redness and irritation of the vagina and possibly the lips of the vagina (vulva).  Burning or painful urination.  Painful intercourse. DIAGNOSIS  Things that may contribute to monilial vaginitis are:  Postmenopausal and virginal states.  Pregnancy.  Infections.  Being tired, sick or stressed, especially if you had monilial vaginitis in the past.  Diabetes. Good control will help lower the chance.  Birth control pills.  Tight fitting garments.  Using bubble bath, feminine  sprays, douches or deodorant tampons.  Taking certain medications that kill germs (antibiotics).  Sporadic recurrence can occur if you become ill. TREATMENT  Your caregiver will give you medication.  There are several kinds of anti monilial vaginal creams and suppositories specific for monilial vaginitis. For recurrent yeast infections, use a suppository or cream in the vagina 2 times a week, or as directed.  Anti-monilial or steroid cream for the itching or irritation of the vulva may also be used. Get your caregiver's permission.  Painting the vagina with methylene blue solution may help if the monilial cream does not work.  Eating yogurt may help prevent monilial vaginitis. HOME CARE INSTRUCTIONS   Finish all medication as prescribed.  Do not have sex until treatment is completed or after your caregiver tells you it is okay.  Take warm sitz baths.  Do not douche.  Do not use tampons, especially scented ones.  Wear cotton underwear.  Avoid tight pants and panty hose.  Tell your sexual partner that you have a yeast infection. They should go to their caregiver if they have symptoms such as mild rash or itching.  Your sexual partner should be treated as well if your infection is difficult to eliminate.  Practice safer sex. Use condoms.  Some vaginal medications cause latex condoms to fail. Vaginal medications that harm condoms are:  Cleocin cream.  Butoconazole (Femstat).  Terconazole (Terazol) vaginal suppository.  Miconazole (Monistat) (may be purchased over the counter). SEEK MEDICAL CARE IF:   You have a temperature by mouth above 102 F (38.9 C).  The infection is getting worse after 2 days of treatment.  The infection is not getting better after 3 days of treatment.  You develop blisters in or around your vagina.  You develop vaginal bleeding, and it is not your menstrual period.  You have pain when you urinate.  You develop intestinal  problems.  You have pain with sexual intercourse.   This information is not intended to replace advice given to you by your health care provider. Make sure you discuss any questions you have with your health care provider.   Document Released: 04/21/2005 Document Revised: 10/04/2011 Document Reviewed: 01/13/2015 Elsevier Interactive Patient Education [employer]2016 Elsevier Inc.

## 2015-10-10 NOTE — Patient Instructions (Addendum)
Go to the ED for further evaluation.     Appendicitis Appendicitis is when the appendix is swollen (inflamed). The inflammation can lead to developing a hole (perforation) and a collection of pus (abscess). CAUSES  There is not always an obvious cause of appendicitis. Sometimes it is caused by an obstruction in the appendix. The obstruction can be caused by:  A small, hard, pea-sized ball of stool (fecalith).  Enlarged lymph glands in the appendix. SYMPTOMS   Pain around your belly button (navel) that moves toward your lower right belly (abdomen). The pain can become more severe and sharp as time passes.  Tenderness in the lower right abdomen. Pain gets worse if you cough or make a sudden movement.  Feeling sick to your stomach (nauseous).  Throwing up (vomiting).  Loss of appetite.  Fever.  Constipation.  Diarrhea.  Generally not feeling well. DIAGNOSIS   Physical exam.  Blood tests.  Urine test.  X-rays or a CT scan may confirm the diagnosis. TREATMENT  Once the diagnosis of appendicitis is made, the most common treatment is to remove the appendix as soon as possible. This procedure is called appendectomy. In an open appendectomy, a cut (incision) is made in the lower right abdomen and the appendix is removed. In a laparoscopic appendectomy, usually 3 small incisions are made. Long, thin instruments and a camera tube are used to remove the appendix. Most patients go home in 24 to 48 hours after appendectomy. In some situations, the appendix may have already perforated and an abscess may have formed. The abscess may have a "wall" around it as seen on a CT scan. In this case, a drain may be placed into the abscess to remove fluid, and you may be treated with antibiotic medicines that kill germs. The medicine is given through a tube in your vein (IV). Once the abscess has resolved, it may or may not be necessary to have an appendectomy. You may need to stay in the hospital  longer than 48 hours.   This information is not intended to replace advice given to you by your health care provider. Make sure you discuss any questions you have with your health care provider.   Document Released: 07/12/2005 Document Revised: 01/11/2012 Document Reviewed: 11/27/2014 Elsevier Interactive Patient Education [employer]2016 Elsevier Inc.

## 2015-10-10 NOTE — Progress Notes (Signed)
Patient ID: Shelley Payne, female   DOB: Dec 07, 1995, 20 y.o.   MRN: [id number]   [facility] Yolande Jolly, MD Phone: [phone]  Subjective:   # Right Side Stomach Pain - has been going on since Monday.  - Moderate pain.  - Sharp pain, that is getting worse since Monday.  - Stays on the right side.  - No fever, no chills, no nausea or vomiting.  -No diarrhea or constipation.  - Constant pain, but fluctuates in intensity. Sometimes better with laying on your back.  - no dysuria, no hematuria, no urinary frequency or urgency.  - Never had kidney stones before, and does not run in her family.  - Appetite is not decreased.  - Hurts to press down on it. Pain has not migrated.  - No vaginal discharge, no itching, no burning, has never had an STD, no recent change in sexual activity. Sexually active with men. One partner, she has been with for a while. As far as she knows, he is manogamous.  - Still has her appendix in place. Has never had abdominal surgery.  - Does not think that she could be pregnant. LMP - 2/14. Expecting her period to come on soon.  - She has the skylar IUD in place.  - Taking Tylenol for the pain, does not help.   All relevant systems were reviewed and were negative unless otherwise noted in the HPI  Past Medical History Reviewed problem list.  Medications- reviewed and updated Current Outpatient Prescriptions  Medication Sig Dispense Refill  . ibuprofen (ADVIL,MOTRIN) 600 MG tablet Take 1 tablet (600 mg total) by mouth every 8 (eight) hours as needed. 30 tablet 0  . levothyroxine (SYNTHROID, LEVOTHROID) 50 MCG tablet Take 1 tablet (50 mcg total) by mouth daily before breakfast. 90 tablet 0  . Liraglutide (VICTOZA) 18 MG/3ML SOPN Inject 1.2mg  sub-q daily. 1 pen 2  . lisinopril (ZESTRIL) 2.5 MG tablet Take 1 tablet (2.5 mg total) by mouth daily. 30 tablet 3  . metFORMIN (GLUCOPHAGE) 500 MG tablet Take 1 tablet (500 mg total) by mouth 2  (two) times daily with a meal. 180 tablet 0  . saxagliptin HCl (ONGLYZA) 5 MG TABS tablet Take 1 tablet (5 mg total) by mouth daily. 90 tablet 0   No current facility-administered medications for this visit.   Chief complaint-noted No additions to family history Social history- patient is a non smoker  Objective: BP 126/86 mmHg  Pulse 82  Temp(Src) 97.8 F (36.6 C) (Oral)  Wt 228 lb (103.42 kg)  LMP 09/02/2015 (Approximate) Gen: NAD, alert, cooperative with exam HEENT: NCAT, EOMI, PERRL Neck: FROM, supple CV: RRR, good S1/S2, no murmur Resp: CTABL, no wheezes, non-labored Abd: Tender to palpation in the RLQ, no rebound, definite guarding, + Psoas sign, BS present. Pain with sitting up as well.  Ext: No edema, warm, normal tone, moves UE/LE spontaneously Neuro: Alert and oriented, No gross deficits Skin: no rashes no lesions  Assessment/Plan:  # Likely Appendicitis - pt. With abdominal pain, worsening since Monday, very tender to palpation, positive psoas sign. Moderate severity, still tolerating diet at this time, no nausea, vomiting, or diarrhea. In the differential includes ovarian torsion, PID though her history and symptoms aren't as consistent with this. She does have an IUD in place. Will send for further workup in the ED.  - Sending to the ED for workup.  - Would benefit from abdominal imaging - Needs labs done. Will let her get  these done a the ED.  - Discussed the need for further evaluation in the ED, and the risk of not going including ruptured appendix and peritonitis. She agrees and will go to the ED for further evaluation.   Discussed with preceptor Dr. Gwendolyn GrantWalden.

## 2015-10-10 NOTE — ED Notes (Signed)
Pt reports RLQ pain since Monday. Denies n/v/d, urinary or vaginal symptoms. Sent here from family practice to r/o appendicitis. Denies fever.

## 2015-10-11 LAB — HIV ANTIBODY (ROUTINE TESTING W REFLEX): HIV Screen 4th Generation wRfx: NONREACTIVE

## 2015-10-11 LAB — RPR: RPR: NONREACTIVE

## 2015-10-13 ENCOUNTER — Telehealth: Payer: Self-pay | Admitting: Family Medicine

## 2015-10-13 LAB — URINE CULTURE: Culture: >=100000

## 2015-10-13 LAB — GC/CHLAMYDIA PROBE AMP (~~LOC~~) NOT AT ARMC
Chlamydia: NEGATIVE
Neisseria Gonorrhea: NEGATIVE

## 2015-10-13 NOTE — Telephone Encounter (Signed)
Pt is calling because she received a phone from us today around 11 AM. She said that she is at work and it would be better for us to call her tomorrow. I didn't see any phone notes to attach this to. [name]

## 2015-10-14 ENCOUNTER — Telehealth: Payer: Self-pay | Admitting: *Deleted

## 2015-10-14 NOTE — ED Notes (Signed)
(+)  urine culture, treated with Metronidazole and Fluconazole, if patient asymptomatic no further treatment recommended,  Arthor CaptainAbigail Harris, PA.  Contacted patient who states no further symptoms.  Advised to continue on present medications until prescription is completed.  No new treatment.

## 2015-10-14 NOTE — Progress Notes (Signed)
ED Antimicrobial Stewardship Positive Culture Follow Up   Shelley Payne is an 20 y.o. female who [facility]10[facility]2015 with a chief complaint of  Chief Complaint  Patient presents with  . Abdominal Pain    Recent Results (from the past 720 hour(s))  Urine culture     Status: None   Collection Time: 10/10/15  4:18 PM  Result Value Ref Range Status   Specimen Description URINE, CLEAN CATCH  Final   Special Requests NONE  Final   Culture >=100,000 COLONIES/mL KLEBSIELLA PNEUMONIAE  Final   Report Status 10/13/2015 FINAL  Final   Organism ID, Bacteria KLEBSIELLA PNEUMONIAE  Final      Susceptibility   Klebsiella pneumoniae - MIC*    AMPICILLIN >=32 RESISTANT Resistant     CEFAZOLIN <=4 SENSITIVE Sensitive     CEFTRIAXONE <=1 SENSITIVE Sensitive     CIPROFLOXACIN <=0.25 SENSITIVE Sensitive     GENTAMICIN <=1 SENSITIVE Sensitive     IMIPENEM <=0.25 SENSITIVE Sensitive     NITROFURANTOIN <=16 SENSITIVE Sensitive     TRIMETH/SULFA <=20 SENSITIVE Sensitive     AMPICILLIN/SULBACTAM 4 SENSITIVE Sensitive     PIP/TAZO <=4 SENSITIVE Sensitive     * >=100,000 COLONIES/mL KLEBSIELLA PNEUMONIAE  Wet prep, genital     Status: Abnormal   Collection Time: 10/10/15  7:51 PM  Result Value Ref Range Status   Yeast Wet Prep HPF POC PRESENT (A) NONE SEEN Final   Trich, Wet Prep NONE SEEN NONE SEEN Final   Clue Cells Wet Prep HPF POC PRESENT (A) NONE SEEN Final   WBC, Wet Prep HPF POC MANY (A) NONE SEEN Final   Sperm NONE SEEN  Final   Pt did not have any urinary symptoms at presentation. Was found to have BV and yeast infection, IUD was also found to be in an incorrect position.  [x]  Treated with metronidazole/fluconazole, organism resistant to prescribed antimicrobial  New antibiotic prescription: Will call and check if having UTI symptoms. If no symptoms, no treatment needed. If symptoms, will start Macrobied 100 mg BID x 5 days.  ED Provider: Arthor CaptainAbigail Harris,  PA-C   Shelley Payne 10/14/2015, 9:11 AM Infectious Diseases Pharmacist Phone# [phone]

## 2015-10-15 NOTE — Telephone Encounter (Signed)
Could not locate any calls made to pt, thinking it could be the reminder call for an appointment. Forwarding to MD to see if by chance it was her. Lamonte SakaiZimmerman Rumple, Nathalia Wismer D, [place]

## 2015-10-16 ENCOUNTER — Ambulatory Visit (INDEPENDENT_AMBULATORY_CARE_PROVIDER_SITE_OTHER): Payer: BLUE CROSS/BLUE SHIELD | Admitting: Family Medicine

## 2015-10-16 ENCOUNTER — Encounter: Payer: Self-pay | Admitting: Family Medicine

## 2015-10-16 VITALS — BP 111/76 | HR 66 | Temp 97.7°F | Resp 20 | Ht 62.0 in | Wt 228.3 lb

## 2015-10-16 DIAGNOSIS — T8332XA Displacement of intrauterine contraceptive device, initial encounter: Secondary | ICD-10-CM

## 2015-10-16 DIAGNOSIS — T8389XA Other specified complication of genitourinary prosthetic devices, implants and grafts, initial encounter: Secondary | ICD-10-CM | POA: Diagnosis not present

## 2015-10-16 MED ORDER — HYDROCODONE-ACETAMINOPHEN 5-325 MG PO TABS: 1.0000 | ORAL_TABLET | Freq: Four times a day (QID) | ORAL | Status: DC | PRN

## 2015-10-16 NOTE — Patient Instructions (Signed)
You will be contacted about scheduling an appointment at [facility] to have the [facility]UD taken out and replaced.   Take hydrocodone as needed for the pain. If you experience fever, chills, abdominal pain, vaginal discharge or unusual bleeding, seek medical care right away.   Our clinic's number is [phone]. Feel free to call any time with questions or concerns. We will answer any questions after hours with our 24-hour emergency line at that number as well.   - Dr. Jarvis NewcomerGrunz

## 2015-10-16 NOTE — Progress Notes (Signed)
Subjective: Shelley Payne is a 20 y.o. female presenting for malpositioned IUD.  She reports RLQ pain that was worked up in the ED 6 days ago with CT and U/S showing IUD malposition. It was noted to be in the LUS, embedded in the right side myometrium. She was given pain medication and told to follow up. She has had some pain relief from hydrocodone but continues to have crampy abdominal pain R > L.   - ROS: Denies fever, abdominal pain, vaginal bleeding or discharge.  - Non-smoker  Objective: BP 111/76 mmHg  Pulse 66  Temp(Src) 97.7 F (36.5 C) (Oral)  Resp 20  Ht 5\' 2"  (1.575 m)  Wt 228 lb 4.8 oz (103.556 kg)  BMI 41.75 kg/m2  LMP 09/02/2015 Gen: Well-appearing 20 y.o. female in no distress Pelvic: Normal external female genitalia without lesions. Vaginal mucosa and cervix normal without lesions, discharge or bleeding noted on speculum exam. IUD strings noted. No cervical motion tenderness.  - Alecia LemmingLauren Ducatte, RN present throughout duration of exam.   Assessment/Plan: Shelley Payne is a 20 y.o. female here for IUD malposition, embedded into myometrium.  Case discussed with Dr. Jolayne Pantheronstant. Will set up for an appointment [facility].

## 2015-10-16 NOTE — Telephone Encounter (Signed)
No calls from me.  

## 2015-11-04 ENCOUNTER — Encounter: Payer: Self-pay | Admitting: Obstetrics and Gynecology

## 2015-11-24 ENCOUNTER — Encounter: Payer: Self-pay | Admitting: Obstetrics and Gynecology

## 2016-03-05 ENCOUNTER — Ambulatory Visit (INDEPENDENT_AMBULATORY_CARE_PROVIDER_SITE_OTHER): Payer: BLUE CROSS/BLUE SHIELD | Admitting: Family Medicine

## 2016-03-05 ENCOUNTER — Other Ambulatory Visit (HOSPITAL_COMMUNITY)
Admission: RE | Admit: 2016-03-05 | Discharge: 2016-03-05 | Disposition: A | Discharge status: Home / Self Care | Payer: BLUE CROSS/BLUE SHIELD | Source: Ambulatory Visit | Attending: Family Medicine | Admitting: Family Medicine

## 2016-03-05 ENCOUNTER — Encounter: Payer: Self-pay | Admitting: Family Medicine

## 2016-03-05 VITALS — BP 138/91 | HR 75 | Temp 97.8°F | Wt 231.0 lb

## 2016-03-05 DIAGNOSIS — Z124 Encounter for screening for malignant neoplasm of cervix: Secondary | ICD-10-CM

## 2016-03-05 DIAGNOSIS — Z01419 Encounter for gynecological examination (general) (routine) without abnormal findings: Secondary | ICD-10-CM | POA: Insufficient documentation

## 2016-03-05 DIAGNOSIS — N760 Acute vaginitis: Secondary | ICD-10-CM

## 2016-03-05 DIAGNOSIS — E119 Type 2 diabetes mellitus without complications: Secondary | ICD-10-CM | POA: Diagnosis not present

## 2016-03-05 DIAGNOSIS — T8389XD Other specified complication of genitourinary prosthetic devices, implants and grafts, subsequent encounter: Secondary | ICD-10-CM

## 2016-03-05 DIAGNOSIS — T8332XD Displacement of intrauterine contraceptive device, subsequent encounter: Secondary | ICD-10-CM

## 2016-03-05 DIAGNOSIS — B9689 Other specified bacterial agents as the cause of diseases classified elsewhere: Secondary | ICD-10-CM

## 2016-03-05 DIAGNOSIS — A499 Bacterial infection, unspecified: Secondary | ICD-10-CM

## 2016-03-05 DIAGNOSIS — Z113 Encounter for screening for infections with a predominantly sexual mode of transmission: Secondary | ICD-10-CM | POA: Diagnosis present

## 2016-03-05 DIAGNOSIS — N939 Abnormal uterine and vaginal bleeding, unspecified: Secondary | ICD-10-CM | POA: Diagnosis not present

## 2016-03-05 DIAGNOSIS — Z202 Contact with and (suspected) exposure to infections with a predominantly sexual mode of transmission: Secondary | ICD-10-CM | POA: Diagnosis not present

## 2016-03-05 LAB — POCT WET PREP (WET MOUNT)
CLUE CELLS WET PREP WHIFF POC: POSITIVE
TRICHOMONAS WET PREP HPF POC: ABSENT

## 2016-03-05 LAB — POCT GLYCOSYLATED HEMOGLOBIN (HGB A1C): Hemoglobin A1C: 10.8

## 2016-03-05 MED ORDER — FLUCONAZOLE 150 MG PO TABS: ORAL_TABLET | ORAL | 0 refills | Status: DC

## 2016-03-05 MED ORDER — METRONIDAZOLE 500 MG PO TABS: 500.0000 mg | ORAL_TABLET | Freq: Two times a day (BID) | ORAL | 0 refills | Status: DC

## 2016-03-05 NOTE — Progress Notes (Signed)
Subjective: CC: vaginal bleeding WUJ:[id number]:Shelley Payne is a 20 y.o. female presenting to clinic today for same day appointment. PCP: Delynn FlavinAshly Laelle Bridgett, DO Concerns today include:  1. Vaginal bleeding Patient had an IUD placed 09/2014.  She was later seen in clinic in 09/2015 for RLQ pain and had a CT abdomen and an abdominal u/s that showed a malpositioned IUD.  On abdominal u/s, the IUD was noted to be embedded in the right side myometrium.  She was referred to Dr Jolayne Pantheronstant for removal but patient has had insurance problems prohibiting her from having it removed and replaced.  She notes that spotting started Monday at work.  She reports that she noticed a good amount of blood when she wipes but is not needing a pad or tampon.  She reports that she has had periods with the IUD.  LMP 02/13/16.  She reports periods have been regular, lasting about 1 week each time.  She is worried that bleeding might be a result of an STI.  She reports last unprotected sex was last Monday.  Denies vomiting, abdominal pain, fevers, abnormal vaginal discharge.  Endorses nausea.  2. Diabetes:  Patient has not been taking any of her DM medications because of financial restraints.  She has not established with endocrinology either.  She reports that she is unable to afford the $40 copay for Victoza.  Denies fever, chills, dizziness, LOC, numbness or tingling in extremities or chest pain. Last A1c 11.6 in 10/2014  Social History Reviewed: non smoker. FamHx and MedHx reviewed.  Please see EMR. Health Maintenance: pap smear  ROS: Per HPI  Objective: Office vital signs reviewed. BP (!) 138/91   Pulse 75   Temp 97.8 F (36.6 C) (Oral)   Wt 231 lb (104.8 kg)   SpO2 95%   BMI 42.25 kg/m   Physical Examination:  General: Awake, alert, obese, No acute distress HEENT: Normal, sclera white Cardio: regular rate Pulm: normal WOB on room air GU: external vaginal tissue normal, cervix posterior facing, no punctate lesions  on cervix appreciated, clear mucus discharge from cervical os, moderate bleeding, no cervical motion tenderness, no palpable abdominal/ adnexal masses; IUD strings visualized and appear very long.   Results for orders placed or performed in visit on 03/05/16 (from the past 24 hour(s))  POCT Wet Prep Mellody Drown(Wet [place])     Status: Abnormal   Collection Time: 03/05/16  9:15 AM  Result Value Ref Range   Source Wet Prep POC VAG    WBC, Wet Prep HPF POC 5-10    Bacteria Wet Prep HPF POC Moderate (A) None, Few, Too numerous to count   Clue Cells Wet Prep HPF POC Moderate (A) None, Too numerous to count   Clue Cells Wet Prep Whiff POC Positive Whiff    Yeast Wet Prep HPF POC None    Trichomonas Wet Prep HPF POC Absent Absent  HgB A1c     Status: Abnormal   Collection Time: 03/05/16  9:30 AM  Result Value Ref Range   Hemoglobin A1C 10.8     Assessment/ Plan: 20 y.o. female   1. Possible exposure to STD.  No evidence of PID. - Will plan to mail letter with results. - Cytology - PAP - POCT Wet Prep ([place]et [place]) - HIV antibody - RPR - Condoms provided at today's appt - Safe sex practices encouraged  2. Screening for cervical cancer - Cytology - PAP - GC/CT, Trich added to pap smear  3. Vaginal bleeding.  Could be  from malpositioned IUD.  Strings visualized on exam. - STI w/u as above - Recommend that patient see [facility] for IUD removal.  4. Type 2 diabetes mellitus without complication, without long-term current use of insulin (HCC). Today, A1c 10.8. - Patient to schedule office visit for DM2. - I will continue to manage patient if she is unable/ unwilling to see endocrinology.  I suspect that her uncontrolled BG are a result of noncompliance - Recommend that she make an appt with financial services to see if we can help her get assistance with her copays. - If BG refractory to current therapies, will plan to have patient see Dr Raymondo Band as well. - HgB A1c today  5.  Malpositioned IUD, subsequent encounter. 09/2015 office notes/ ED notes and imaging study reports reviewed.  IUD strings visualized and appear very long.  Unsure of IUD has moved down. - Discussed needing this evaluated. - Ambulatory referral to Gynecology  6. Bacterial vaginosis.  Appreciated on wet prep. - Discussed with patient.  Advised to avoid ETOH. - fluconazole (DIFLUCAN) 150 MG tablet; Take 1 tablet orally now.  May repeat in 3 days if needed. (for yeast infection)  Dispense: 2 tablet; Refill: 0 - metroNIDAZOLE (FLAGYL) 500 MG tablet; Take 1 tablet (500 mg total) by mouth 2 (two) times daily.  Dispense: 14 tablet; Refill: 0  Raliegh Ip, DO PGY-3, [facility]

## 2016-03-05 NOTE — Patient Instructions (Signed)
I will mail you a copy of your labs.  If anything is abnormal, I will call you so we can discuss antibiotics.   Schedule an appointment with me ASAP for diabetes.  We need to get you back on your diabetes medications.  You should also schedule an appointment with the financial coordinator up front, who may be able to help you with meds/ specialist appointment costs.

## 2016-03-06 LAB — HIV ANTIBODY (ROUTINE TESTING W REFLEX): HIV 1&2 Ab, 4th Generation: NONREACTIVE

## 2016-03-06 LAB — RPR

## 2016-03-09 ENCOUNTER — Encounter: Payer: Self-pay | Admitting: Family Medicine

## 2016-03-09 LAB — CYTOLOGY - PAP

## 2016-05-14 ENCOUNTER — Encounter: Payer: Self-pay | Admitting: Family Medicine

## 2017-05-09 ENCOUNTER — Emergency Department (HOSPITAL_COMMUNITY)
Admission: EM | Admit: 2017-05-09 | Discharge: 2017-05-09 | Disposition: A | Discharge status: Home / Self Care | Payer: BLUE CROSS/BLUE SHIELD | Attending: Emergency Medicine | Admitting: Emergency Medicine

## 2017-05-09 ENCOUNTER — Encounter (HOSPITAL_COMMUNITY): Payer: Self-pay | Admitting: *Deleted

## 2017-05-09 DIAGNOSIS — R102 Pelvic and perineal pain: Secondary | ICD-10-CM | POA: Diagnosis present

## 2017-05-09 DIAGNOSIS — Z23 Encounter for immunization: Secondary | ICD-10-CM | POA: Diagnosis not present

## 2017-05-09 DIAGNOSIS — J45909 Unspecified asthma, uncomplicated: Secondary | ICD-10-CM | POA: Insufficient documentation

## 2017-05-09 DIAGNOSIS — Z7984 Long term (current) use of oral hypoglycemic drugs: Secondary | ICD-10-CM | POA: Diagnosis not present

## 2017-05-09 DIAGNOSIS — I10 Essential (primary) hypertension: Secondary | ICD-10-CM | POA: Diagnosis not present

## 2017-05-09 DIAGNOSIS — Z79899 Other long term (current) drug therapy: Secondary | ICD-10-CM | POA: Insufficient documentation

## 2017-05-09 DIAGNOSIS — E039 Hypothyroidism, unspecified: Secondary | ICD-10-CM | POA: Diagnosis not present

## 2017-05-09 DIAGNOSIS — E119 Type 2 diabetes mellitus without complications: Secondary | ICD-10-CM | POA: Diagnosis not present

## 2017-05-09 DIAGNOSIS — T7421XA Adult sexual abuse, confirmed, initial encounter: Secondary | ICD-10-CM | POA: Insufficient documentation

## 2017-05-09 MED ORDER — HEPATITIS B VAC RECOMBINANT 10 MCG/ML IJ SUSP
1.0000 mL | Freq: Once | INTRAMUSCULAR | Status: AC
  Administered 2017-05-09: 10 ug via INTRAMUSCULAR
  Filled 2017-05-09: qty 1

## 2017-05-09 MED ORDER — CEFTRIAXONE SODIUM 250 MG IJ SOLR
250.0000 mg | Freq: Once | INTRAMUSCULAR | Status: AC
  Administered 2017-05-09: 250 mg via INTRAMUSCULAR

## 2017-05-09 MED ORDER — IBUPROFEN 400 MG PO TABS
400.0000 mg | ORAL_TABLET | Freq: Once | ORAL | Status: AC
  Administered 2017-05-09: 400 mg via ORAL
  Filled 2017-05-09: qty 1

## 2017-05-09 MED ORDER — METRONIDAZOLE 500 MG PO TABS
2000.0000 mg | ORAL_TABLET | Freq: Once | ORAL | Status: AC
  Administered 2017-05-09: 2000 mg via ORAL

## 2017-05-09 MED ORDER — LIDOCAINE HCL (PF) 1 % IJ SOLN
0.9000 mL | Freq: Once | INTRAMUSCULAR | Status: AC
  Administered 2017-05-09: 0.9 mL

## 2017-05-09 MED ORDER — PROMETHAZINE HCL 25 MG PO TABS
25.0000 mg | ORAL_TABLET | Freq: Four times a day (QID) | ORAL | Status: DC | PRN
  Administered 2017-05-09: 75 mg via ORAL

## 2017-05-09 MED ORDER — AZITHROMYCIN 250 MG PO TABS
1000.0000 mg | ORAL_TABLET | Freq: Once | ORAL | Status: AC
  Administered 2017-05-09: 1000 mg via ORAL
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
## 2017-05-09 NOTE — ED Notes (Signed)
SANE RN spoke with Pt on phone

## 2017-05-09 NOTE — SANE Note (Signed)
ON 05/09/2017, AT APPROXIMATELY 1652 HOURS, A REFERRAL FOR COUNSELING SERVICES WAS EMAILED TO MS. HOOKS, AT [facility], ON BEHALF OF THE PATIENT, AT HER REQUEST.  ([email or web address])

## 2017-05-09 NOTE — Discharge Instructions (Addendum)
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 disease, like asthma -prostate trouble -pain or difficulty passing urine -seizures -an unusual or allergic reaction to promethazine or phenothiazines, other medicines, foods, dyes, or preservatives -pregnant or trying to get pregnant -breast-feeding How should I use this medicine? Take this medicine by mouth with a glass of water. Follow the directions on the prescription label. Take your doses at regular intervals. Do not take your medicine more often than directed. Talk to your pediatrician regarding the use of this medicine in children. Special care may be needed. This medicine should not be given to infants and children younger than 68 years old. Overdosage: If you think you have taken too much of this medicine contact a poison control center or emergency room at once. NOTE: This medicine is only for you. Do not share this medicine with others. What if I miss a dose? If you miss a dose, take it as soon as you can. If it is almost time for your next dose, take only that dose. Do  not take double or extra doses. What may interact with this medicine? Do not take this medicine with any of the following medications: -cisapride -dofetilide -dronedarone -MAOIs like Carbex, Eldepryl, Marplan, Nardil, Parnate -pimozide -quinidine, including dextromethorphan; quinidine -thioridazine -ziprasidone This medicine may also interact with the following medications: -certain medicines for depression, anxiety, or psychotic disturbances -certain medicines for anxiety or sleep -certain medicines for seizures like carbamazepine, phenobarbital, phenytoin -certain medicines for movement abnormalities as in Parkinson's disease, or for gastrointestinal problems -epinephrine -medicines for allergies or colds -muscle relaxants -narcotic medicines for pain -other medicines that prolong the QT interval (cause an abnormal heart rhythm) -tramadol -trimethobenzamide This list may not describe all possible interactions. Give your health care provider a list of all the medicines, herbs, non-prescription drugs, or dietary supplements you use. Also tell them if you smoke, drink alcohol, or use illegal drugs. Some items may interact with your medicine. What should I watch for while using this medicine? Tell your doctor or health care professional if your symptoms do not start to get better in 1 to 2 days. You may get drowsy or dizzy. Do not drive, use machinery, or do anything that needs mental alertness until you know how this medicine affects you. To reduce the risk of dizzy or fainting spells, do not stand or sit up quickly, especially if you are an older patient. Alcohol may increase dizziness and drowsiness. Avoid alcoholic drinks. Your mouth may get dry. Chewing sugarless gum or sucking hard candy, and drinking plenty of water may help. Contact your doctor if the problem does not go away or is severe. This medicine may cause dry eyes and blurred vision. If you wear contact lenses you may feel  some discomfort. Lubricating drops may help. See your eye doctor if the problem does not go away or is severe. This medicine can make you more sensitive to the sun. Keep out of the sun. If you cannot avoid being in the sun, wear protective clothing and use sunscreen. Do not use sun lamps or tanning beds/booths. If you are diabetic, check your blood-sugar levels regularly. What side effects may I notice from receiving this medicine? Side effects that you should report to your doctor or health  care professional as soon as possible: -blurred vision -irregular heartbeat, palpitations or chest pain -muscle or facial twitches -pain or difficulty passing urine -seizures -skin rash -slowed or shallow breathing -unusual bleeding or bruising -yellowing of the eyes or skin Side effects that usually do not require medical attention (report to your doctor or health care professional if they continue or are bothersome): -headache -nightmares, agitation, nervousness, excitability, not able to sleep (these are more likely in children) -stuffy nose This list may not describe all possible side effects. Call your doctor for medical advice about side effects. You may report side effects to FDA at 1-800-FDA-1088. Where should I keep my medicine? Keep out of the reach of children. Store at room temperature, between 20 and 25 degrees C (68 and 77 degrees F). Protect from light. Throw away any unused medicine after the expiration date. NOTE: This sheet is a summary. It may not cover all possible information. If you have questions about this medicine, talk to your doctor, pharmacist, or health care provider.  2017 Elsevier/Gold Standard (2013-03-13 15:04:46)  Hepatitis B Vaccine, Recombinant injection (RECOMBIVAX-HB INJECTION   What is this medicine? HEPATITIS B VACCINE (hep uh TAHY tis B VAK seen) is a vaccine. It is used to prevent an infection with the hepatitis B virus. This medicine may be used for  other purposes; ask your health care provider or pharmacist if you have questions. COMMON BRAND NAME(S): Engerix-B, Recombivax HB What should I tell my health care provider before I take this medicine? They need to know if you have any of these conditions: -fever, infection -heart disease -hepatitis B infection -immune system problems -kidney disease -an unusual or allergic reaction to vaccines, yeast, other medicines, foods, dyes, or preservatives -pregnant or trying to get pregnant -breast-feeding How should I use this medicine? This vaccine is for injection into a muscle. It is given by a health care professional. A copy of Vaccine Information Statements will be given before each vaccination. Read this sheet carefully each time. The sheet may change frequently. Talk to your pediatrician regarding the use of this medicine in children. While this drug may be prescribed for children as young as newborn for selected conditions, precautions do apply. Overdosage: If you think you have taken too much of this medicine contact a poison control center or emergency room at once. NOTE: This medicine is only for you. Do not share this medicine with others. What if I miss a dose? It is important not to miss your dose. Call your doctor or health care professional if you are unable to keep an appointment. What may interact with this medicine? -medicines that suppress your immune function like adalimumab, anakinra, infliximab -medicines to treat cancer -steroid medicines like prednisone or cortisone This list may not describe all possible interactions. Give your health care provider a list of all the medicines, herbs, non-prescription drugs, or dietary supplements you use. Also tell them if you smoke, drink alcohol, or use illegal drugs. Some items may interact with your medicine.   What should I watch for while using this medicine? See your health care provider for all shots of this vaccine as  directed. You must have 3 shots of this vaccine for protection from hepatitis B infection. Tell your doctor right away if you have any serious or unusual side effects after getting this vaccine. What side effects may I notice from receiving this medicine? Side effects that you should report to your doctor or health care professional as soon as  possible: -allergic reactions like skin rash, itching or hives, swelling of the face, lips, or tongue -breathing problems -confused, irritated -fast, irregular heartbeat -flu-like syndrome -numb, tingling pain -seizures -unusually weak or tired Side effects that usually do not require medical attention (report to your doctor or health care professional if they continue or are bothersome): -diarrhea -fever -headache -loss of appetite -muscle pain -nausea -pain, redness, swelling, or irritation at site where injected -tiredness This list may not describe all possible side effects. Call your doctor for medical advice about side effects. You may report side effects to FDA at 1-800-FDA-1088. Where should I keep my medicine? This drug is given in a hospital or clinic and will not be stored at home. NOTE: This sheet is a summary. It may not cover all possible information. If you have questions about this medicine, talk to your doctor, pharmacist, or health care provider.  2017 Elsevier/Gold Standard (2013-11-12 13:26:01)
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
## 2017-05-23 ENCOUNTER — Ambulatory Visit (INDEPENDENT_AMBULATORY_CARE_PROVIDER_SITE_OTHER): Payer: BLUE CROSS/BLUE SHIELD | Admitting: Advanced Practice Midwife

## 2017-05-23 ENCOUNTER — Encounter: Payer: Self-pay | Admitting: Advanced Practice Midwife

## 2017-05-23 DIAGNOSIS — Z202 Contact with and (suspected) exposure to infections with a predominantly sexual mode of transmission: Secondary | ICD-10-CM

## 2017-05-23 DIAGNOSIS — Z113 Encounter for screening for infections with a predominantly sexual mode of transmission: Secondary | ICD-10-CM | POA: Diagnosis not present

## 2017-05-23 DIAGNOSIS — T7421XA Adult sexual abuse, confirmed, initial encounter: Secondary | ICD-10-CM

## 2017-05-23 DIAGNOSIS — G479 Sleep disorder, unspecified: Secondary | ICD-10-CM

## 2017-05-23 DIAGNOSIS — Z3202 Encounter for pregnancy test, result negative: Secondary | ICD-10-CM | POA: Diagnosis not present

## 2017-05-23 DIAGNOSIS — T7421XD Adult sexual abuse, confirmed, subsequent encounter: Secondary | ICD-10-CM

## 2017-05-23 HISTORY — DX: Adult sexual abuse, confirmed, initial encounter: T74.21XA

## 2017-05-23 LAB — POCT PREGNANCY, URINE: Preg Test, Ur: NEGATIVE

## 2017-05-23 MED ORDER — PROMETHAZINE HCL 25 MG PO TABS: 25.0000 mg | ORAL_TABLET | Freq: Four times a day (QID) | ORAL | 0 refills | Status: DC | PRN
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
## 2017-05-24 LAB — HIV ANTIBODY (ROUTINE TESTING W REFLEX): HIV Screen 4th Generation wRfx: NONREACTIVE

## 2017-05-24 LAB — HEPATITIS B SURFACE ANTIGEN: Hepatitis B Surface Ag: NEGATIVE

## 2017-05-24 LAB — RPR: RPR Ser Ql: NONREACTIVE

## 2017-05-24 LAB — HEPATITIS C ANTIBODY

## 2017-05-25 LAB — GC/CHLAMYDIA PROBE AMP (~~LOC~~) NOT AT ARMC
CHLAMYDIA, DNA PROBE: NEGATIVE
Neisseria Gonorrhea: NEGATIVE
TRICH (WINDOWPATH): NEGATIVE

## 2017-05-30 ENCOUNTER — Institutional Professional Consult (permissible substitution): Payer: Self-pay

## 2017-05-30 NOTE — BH Specialist Note (Deleted)
Integrated Behavioral Health Initial Visit  MRN: [id number] Name: Shelley AliasJauniquia Dohn  Number of Integrated Behavioral Health Clinician visits:: 1/6 Session Start time: ***  Session End time: *** Total time: {IBH Total Time:21014050}  Type of Service: Integrated Behavioral Health- Individual/Family Interpretor:No. Interpretor Name and Language: n/a   Warm Hand Off Completed.       SUBJECTIVE: Shelley Payne is a 21 y.o. female accompanied by {CHL AMB ACCOMPANIED WU:[id number]}BY:[phone]} Patient was referred by Wynelle BourgeoisMarie Williams, CNM for ***. Patient reports the following symptoms/concerns: *** Duration of problem: ***; Severity of problem: {Mild/Moderate/Severe:20260}  OBJECTIVE: Mood: {BHH MOOD:22306} and Affect: {BHH AFFECT:22307} Risk of harm to self or others: {CHL AMB BH Suicide Current Mental Status:21022748}  LIFE CONTEXT: Family and Social: *** School/Work: *** Self-Care: *** Life Changes: ***  GOALS ADDRESSED: Patient will: 1. Reduce symptoms of: {IBH Symptoms:21014056} 2. Increase knowledge and/or ability of: {IBH Patient Tools:21014057}  3. Demonstrate ability to: {IBH Goals:21014053}  INTERVENTIONS: Interventions utilized: {IBH Interventions:21014054}  Standardized Assessments completed: {IBH Screening Tools:21014051}  ASSESSMENT: Patient currently experiencing ***.   Patient may benefit from ***.  PLAN: 1. Follow up with behavioral health clinician on : *** 2. Behavioral recommendations: *** 3. Referral(s): {IBH Referrals:21014055} 4. "From scale of 1-10, how likely are you to follow plan?": ***  Kijana Cromie C Asar Evilsizer, LCSWA

## 2017-06-06 ENCOUNTER — Institutional Professional Consult (permissible substitution): Payer: Self-pay

## 2017-06-06 NOTE — BH Specialist Note (Deleted)
Integrated Behavioral Health Initial Visit  MRN: [id number] Name: Tyson AliasJauniquia Farner  Number of Integrated Behavioral Health Clinician visits:: {IBH Number of Visits:21014052} Session Start time: ***  Session End time: *** Total time: {IBH Total Time:21014050}  Type of Service: Integrated Behavioral Health- Individual/Family Interpretor:{yes [id number]}no:[id number]} Interpretor Name and Language: ***   Warm Hand Off Completed.       SUBJECTIVE: Tyson AliasJauniquia Sanders is a 21 y.o. female ***(no referral) *** accompanied by {CHL AMB ACCOMPANIED ON:[id number]}[phone]} Patient was referred by *** for ***. Patient reports the following symptoms/concerns: *** Duration of problem: ***; Severity of problem: {Mild/Moderate/Severe:20260}  OBJECTIVE: Mood: {BHH MOOD:22306} and Affect: {BHH AFFECT:22307} Risk of harm to self or others: {CHL AMB BH Suicide Current Mental Status:21022748}  LIFE CONTEXT: Family and Social: *** School/Work: *** Self-Care: *** Life Changes: ***  GOALS ADDRESSED: Patient will: 1. Reduce symptoms of: {IBH Symptoms:21014056} 2. Increase knowledge and/or ability of: {IBH Patient Tools:21014057}  3. Demonstrate ability to: {IBH Goals:21014053}  INTERVENTIONS: Interventions utilized: {IBH Interventions:21014054}  Standardized Assessments completed: {IBH Screening Tools:21014051}  ASSESSMENT: Patient currently experiencing ***.   Patient may benefit from ***.  PLAN: 1. Follow up with behavioral health clinician on : *** 2. Behavioral recommendations: *** 3. Referral(s): {IBH Referrals:21014055} 4. "From scale of 1-10, how likely are you to follow plan?": ***  Demi Trieu C Timisha Mondry, LCSWA

## 2017-08-25 ENCOUNTER — Encounter (INDEPENDENT_AMBULATORY_CARE_PROVIDER_SITE_OTHER): Payer: Medicaid Other

## 2017-08-29 ENCOUNTER — Encounter (INDEPENDENT_AMBULATORY_CARE_PROVIDER_SITE_OTHER): Payer: Self-pay | Admitting: Family Medicine

## 2017-08-29 ENCOUNTER — Ambulatory Visit (INDEPENDENT_AMBULATORY_CARE_PROVIDER_SITE_OTHER): Payer: Self-pay | Admitting: Family Medicine

## 2017-08-29 VITALS — BP 113/79 | HR 85 | Temp 97.9°F | Ht 63.0 in | Wt 221.0 lb

## 2017-08-29 DIAGNOSIS — E559 Vitamin D deficiency, unspecified: Secondary | ICD-10-CM

## 2017-08-29 DIAGNOSIS — Z1331 Encounter for screening for depression: Secondary | ICD-10-CM

## 2017-08-29 DIAGNOSIS — E1165 Type 2 diabetes mellitus with hyperglycemia: Secondary | ICD-10-CM | POA: Insufficient documentation

## 2017-08-29 DIAGNOSIS — R0602 Shortness of breath: Secondary | ICD-10-CM

## 2017-08-29 DIAGNOSIS — R5383 Other fatigue: Secondary | ICD-10-CM

## 2017-08-29 DIAGNOSIS — Z0289 Encounter for other administrative examinations: Secondary | ICD-10-CM

## 2017-08-29 DIAGNOSIS — Z6839 Body mass index (BMI) 39.0-39.9, adult: Secondary | ICD-10-CM

## 2017-08-29 HISTORY — DX: Shortness of breath: R06.02

## 2017-08-29 MED ORDER — ACCU-CHEK AVIVA DEVI: 0 refills | Status: AC

## 2017-08-29 MED ORDER — GLUCOSE BLOOD VI STRP: ORAL_STRIP | 0 refills | Status: DC

## 2017-08-29 MED ORDER — ACCU-CHEK MULTICLIX LANCETS MISC
0 refills | Status: DC
Start: 2017-08-29 — End: 2019-08-28

## 2017-08-29 NOTE — Progress Notes (Signed)
.  Office: [phone]  /  Fax: [phone]   HPI:   Chief Complaint: OBESITY  Shelley Payne (MR# [id number]) is a 22 y.o. female who presents on 08/29/2017 for obesity evaluation and treatment. Current BMI is Body mass index is 39.15 kg/m.Shelley Payne has struggled with obesity for years and has been unsuccessful in either losing weight or maintaining long term weight loss. Shelley Payne attended our information session and states she is currently in the action stage of change and ready to dedicate time achieving and maintaining a healthier weight.   Shelley Payne states she hasn't tried to diet previously.  Shelley Payne states her family eats meals together her desired weight loss is 86 lbs she has been heavy most of  her life she started gaining weight in middle school her heaviest weight ever was 235 lbs. she has significant food cravings issues  she snacks frequently in the evenings she skips meals frequently she is frequently drinking liquids with calories she frequently makes poor food choices she has problems with excessive hunger  she frequently eats larger portions than normal  she has binge eating behaviors she struggles with emotional eating    Fatigue Shelley Payne feels her energy is lower than it should be. This has worsened with weight gain and has not worsened recently. Shelley Payne admits to daytime somnolence and  admits to waking up still tired. Shelley Payne is at risk for obstructive sleep apnea. Patent has a history of symptoms of daytime fatigue. Shelley Payne generally gets 8 hours of sleep per night, and states they generally have generally restful sleep. Snoring is present. Apneic episodes are not present. Epworth Sleepiness Score is 11.  Dyspnea on exertion Shelley Payne notes increasing shortness of breath with exercising and seems to be worsening over time with weight gain. She notes getting out of breath sooner with activity than she used to. This has not gotten worse  recently. Shelley Payne denies orthopnea.  Diabetes II Shelley Payne has a diagnosis of diabetes type II uncontrolled with hyperglycemia. Shelley Payne was diagnosed in 2006, she is not on medications and she is not checking her BGs, states she doesn't have a meter. Her Hgb A1c in 2017 was 10.  Vitamin D deficiency Shelley Payne has a diagnosis of vitamin D deficiency. She is not on Vit D, notes fatigue and denies nausea, vomiting or muscle weakness. No recent labs.  Depression Screen Shelley Payne (modified PHQ-9) score was  Depression screen PHQ 2/9 08/29/2017  Decreased Interest 2  Down, Depressed, Hopeless 1  PHQ - 2 Score 3  Altered sleeping 0  Tired, decreased energy 3  Change in appetite 3  Feeling bad or failure about yourself  3  Trouble concentrating 0  Moving slowly or fidgety/restless 0  Suicidal thoughts 0  PHQ-9 Score 12    ALLERGIES: No Known Allergies  MEDICATIONS: Current Outpatient Medications on File Prior to Visit  Medication Sig Dispense Refill  . IUD'S IU by Intrauterine route.     No current facility-administered medications on file prior to visit.     PAST MEDICAL HISTORY: Past Medical History:  Diagnosis Date  . Acanthosis nigricans, acquired   . Asthma   . Diabetes mellitus, type II (HCC)   . Environmental allergies   . Hypertension   . Hypothyroidism, acquired, autoimmune   . Obesity   . Thyroiditis, autoimmune     PAST SURGICAL HISTORY: Past Surgical History:  Procedure Laterality Date  . none      SOCIAL HISTORY: Social History   Tobacco Use  .  Smoking status: Never Smoker  . Smokeless tobacco: Never Used  Substance Use Topics  . Alcohol use: No  . Drug use: No    FAMILY HISTORY: Family History  Problem Relation Age of Onset  . Obesity Mother   . Hypertension Mother   . Obesity Maternal Aunt   . Asthma Maternal Aunt   . Hypertension Maternal Aunt   . Diabetes Maternal Grandfather   . Hypertension Maternal Grandfather    . Hyperlipidemia Maternal Grandmother     ROS: Review of Systems  Constitutional: Positive for malaise/fatigue. Negative for weight loss.  Eyes: Positive for photophobia.       Wear glasses or contacts  Respiratory: Positive for shortness of breath.   Cardiovascular: Negative for orthopnea.       Chest pain discomfort  Gastrointestinal: Negative for nausea and vomiting.  Musculoskeletal:       Negative muscle weakness  Skin:       Hair or nail changes  Endo/Heme/Allergies: Positive for polydipsia.       Positive hyperglycemia  Psychiatric/Behavioral: Positive for depression. Negative for suicidal ideas.    PHYSICAL EXAM: Blood pressure 113/79, pulse 85, temperature 97.9 F (36.6 C), temperature source Oral, height 5\' 3"  (1.6 m), weight 221 lb (100.2 kg), last menstrual period 08/14/2017, SpO2 97 %. Body mass index is 39.15 kg/m. Physical Exam  Constitutional: She is oriented to person, place, and time. She appears well-developed and well-nourished.  HENT:  Head: Normocephalic and atraumatic.  Nose: Nose normal.  Eyes: EOM are normal. No scleral icterus.  Neck: Normal range of motion. Neck supple. No thyromegaly present.  Cardiovascular: Normal rate and regular rhythm.  Pulmonary/Chest: Effort normal. No respiratory distress.  Abdominal: Soft. There is no tenderness.  + Obesity  Musculoskeletal:  Range of Motion normal in all 4 extremities  Trace edema noted in bilateral lower extremities  Neurological: She is alert and oriented to person, place, and time. Coordination normal.  Skin: Skin is warm and dry.  + acanthosis nigricans  Psychiatric: She has a normal Payne and affect. Her behavior is normal.  Vitals reviewed.   RECENT LABS AND TESTS: BMET    Component Value Date/Time   NA 139 10/10/2015 1614   K 4.0 10/10/2015 1614   CL 102 10/10/2015 1614   CO2 27 10/10/2015 1614   GLUCOSE 303 (H) 10/10/2015 1614   BUN 8 10/10/2015 1614   CREATININE 0.62 10/10/2015  1614   CREATININE 0.55 05/31/2014 1502   CALCIUM 9.7 10/10/2015 1614   GFRNONAA >60 10/10/2015 1614   GFRAA >60 10/10/2015 1614   Lab Results  Component Value Date   HGBA1C 10.8 03/05/2016   No results found for: INSULIN CBC    Component Value Date/Time   WBC 10.5 10/10/2015 1614   RBC 5.05 10/10/2015 1614   HGB 12.8 10/10/2015 1614   HCT 40.4 10/10/2015 1614   PLT 286 10/10/2015 1614   MCV 80.0 10/10/2015 1614   MCH 25.3 (L) 10/10/2015 1614   MCHC 31.7 10/10/2015 1614   RDW 13.9 10/10/2015 1614   Iron/TIBC/Ferritin/ %Sat No results found for: IRON, TIBC, FERRITIN, IRONPCTSAT Lipid Panel     Component Value Date/Time   CHOL 174 02/23/2013 1441   TRIG 106 02/23/2013 1441   HDL 41 02/23/2013 1441   CHOLHDL 4.2 02/23/2013 1441   VLDL 21 02/23/2013 1441   LDLCALC 112 (H) 02/23/2013 1441   Hepatic Function Panel     Component Value Date/Time   PROT 7.3 10/10/2015 1614  ALBUMIN 3.8 10/10/2015 1614   AST 39 10/10/2015 1614   ALT 52 10/10/2015 1614   ALKPHOS 78 10/10/2015 1614   BILITOT 0.2 (L) 10/10/2015 1614      Component Value Date/Time   TSH 2.755 05/31/2014 1502   Vitamin D No recent labs  ECG  shows NSR with a rate of 77 BPM INDIRECT CALORIMETER done today shows a VO2 of 196 and a REE of 1363. Her calculated basal metabolic rate is 1610 thus her basal metabolic rate is worse than expected.    ASSESSMENT AND PLAN: Other fatigue - Plan: EKG 12-Lead, CBC With Differential, VITAMIN D 25 Hydroxy (Vit-D Deficiency, Fractures), T3, T4, free, TSH  Shortness of breath on exertion - Plan: CBC With Differential  Type 2 diabetes mellitus with hyperglycemia, without long-term current use of insulin (HCC) - Plan: Comprehensive metabolic panel, Hemoglobin A1c, Insulin, random, Lipid Panel With LDL/HDL Ratio, Lancets (ACCU-CHEK MULTICLIX) lancets, glucose blood (ACCU-CHEK AVIVA) test strip, Blood Glucose Monitoring Suppl (ACCU-CHEK AVIVA) device  Vitamin D  deficiency  Depression screening  Class 2 severe obesity with serious comorbidity and body mass index (BMI) of 39.0 to 39.9 in adult, unspecified obesity type (HCC)  PLAN:  Fatigue Shelley Payne was informed that her fatigue may be related to obesity, depression or many other causes. We will check labs, and in the meanwhile Shelley Payne has agreed to work on diet, exercise and weight loss to help with fatigue. Proper sleep hygiene was discussed including the need for 7-8 hours of quality sleep each night. A sleep study was not ordered based on symptoms and Epworth score.  Dyspnea on exertion Jennings's shortness of breath appears to be obesity related and exercise induced. She has agreed to work on weight loss and gradually increase exercise to treat her exercise induced shortness of breath. If Shelley Payne follows our instructions and loses weight without improvement of her shortness of breath, we will plan to refer to pulmonology. We will monitor this condition regularly. Shelley Payne agrees to this plan.  Diabetes II Shelley Payne has been given extensive diabetes education by myself today including ideal fasting and post-prandial blood glucose readings, individual ideal Hgb A1c goals and hypoglycemia prevention. We discussed the importance of good blood sugar control to decrease the likelihood of diabetic complications such as nephropathy, neuropathy, limb loss, blindness, coronary artery disease, and death. We discussed the importance of intensive lifestyle modification including diet, exercise and weight loss as the first line treatment for diabetes. Shelley Payne agrees to check BGs BID, start diet, and we will send glucometer, strips, and lancets to her pharmacy. Shelley Payne agrees to follow up with our clinic in 2 weeks.  Vitamin D Deficiency Shelley Payne was informed that low vitamin D levels contributes to fatigue and are associated with obesity, breast, and colon cancer. She will follow up for routine testing  of vitamin D, at least 2-3 times per year. She was informed of the risk of over-replacement of vitamin D and agrees to not increase her dose unless she discusses this with [place] first. We will check Vit D level and Shelley Payne agrees to follow up with our clinic in 2 weeks.  Depression Screen Shelley Payne had a moderately positive depression screening. Depression is commonly associated with obesity and often results in emotional eating behaviors. We will monitor this closely and work on CBT to help improve the non-hunger eating patterns. Referral to Psychology may be required if no improvement is seen as she continues in our clinic.  Obesity Shelley Payne is currently in the action  stage of change and her goal is to continue with weight loss efforts She has agreed to follow the Category 2 plan Shelley Payne has been instructed to work up to a goal of 150 minutes of combined cardio and strengthening exercise per week for weight loss and overall health benefits. We discussed the following Behavioral Modification Strategies today: increasing lean protein intake and decreasing simple carbohydrates   Shelley Payne has agreed to follow up with our clinic in 2 weeks. She was informed of the importance of frequent follow up visits to maximize her success with intensive lifestyle modifications for her multiple health conditions. She was informed we would discuss her lab results at her next visit unless there is a critical issue that needs to be addressed sooner. [place] agreed to keep her next visit at the agreed upon time to discuss these results.    OBESITY BEHAVIORAL INTERVENTION VISIT  Today's visit was # 1 out of 22.  Starting weight: 221 lbs Starting date: 08/29/17 Today's weight : 221 lbs  Today's date: 08/29/2017 Total lbs lost to date: 0 (Patients must lose 7 lbs in the first 6 months to continue with counseling)   ASK: We discussed the diagnosis of obesity with Shelley Payne today and Elliotte agreed to  give us permission to discuss obesity behavioral modification therapy today.  ASSESS: Shelley RanaJauniquia has the diagnosis of obesity and her BMI today is 39.16 [place] is in the action stage of change   ADVISE: Enyah was educated on the multiple health risks of obesity as well as the benefit of weight loss to improve her health. She was advised of the need for long term treatment and the importance of lifestyle modifications.  AGREE: Multiple dietary modification options and treatment options were discussed and  Brittie agreed to the above obesity treatment plan.   I, Burt KnackSharon Martin, am acting as transcriptionist for Quillian Quincearen Damilola Flamm, MD   I have reviewed the above documentation for accuracy and completeness, and I agree with the above. -Quillian Quincearen Jamone Garrido, MD

## 2017-08-30 LAB — COMPREHENSIVE METABOLIC PANEL
A/G RATIO: 1.4 (ref 1.2–2.2)
ALT: 41 IU/L — ABNORMAL HIGH (ref 0–32)
AST: 35 IU/L (ref 0–40)
Albumin: 4.3 g/dL (ref 3.5–5.5)
Alkaline Phosphatase: 80 IU/L (ref 39–117)
BUN/Creatinine Ratio: 15 (ref 9–23)
BUN: 8 mg/dL (ref 6–20)
Bilirubin Total: 0.3 mg/dL (ref 0.0–1.2)
CALCIUM: 9.7 mg/dL (ref 8.7–10.2)
CO2: 24 mmol/L (ref 20–29)
CREATININE: 0.55 mg/dL — AB (ref 0.57–1.00)
Chloride: 97 mmol/L (ref 96–106)
GFR calc Af Amer: 153 mL/min/{1.73_m2} (ref >59)
GFR, EST NON AFRICAN AMERICAN: 133 mL/min/{1.73_m2} (ref >59)
GLOBULIN, TOTAL: 3.1 g/dL (ref 1.5–4.5)
Glucose: 267 mg/dL — ABNORMAL HIGH (ref 65–99)
POTASSIUM: 4.4 mmol/L (ref 3.5–5.2)
SODIUM: 138 mmol/L (ref 134–144)
TOTAL PROTEIN: 7.4 g/dL (ref 6.0–8.5)

## 2017-08-30 LAB — CBC WITH DIFFERENTIAL
BASOS ABS: 0 10*3/uL (ref 0.0–0.2)
BASOS: 0 %
EOS (ABSOLUTE): 0.4 10*3/uL (ref 0.0–0.4)
Eos: 4 %
HEMOGLOBIN: 13.1 g/dL (ref 11.1–15.9)
Hematocrit: 41.5 % (ref 34.0–46.6)
IMMATURE GRANS (ABS): 0 10*3/uL (ref 0.0–0.1)
Immature Granulocytes: 0 %
LYMPHS ABS: 3.1 10*3/uL (ref 0.7–3.1)
Lymphs: 33 %
MCH: 25.5 pg — ABNORMAL LOW (ref 26.6–33.0)
MCHC: 31.6 g/dL (ref 31.5–35.7)
MCV: 81 fL (ref 79–97)
Monocytes Absolute: 0.4 10*3/uL (ref 0.1–0.9)
Monocytes: 4 %
NEUTROS ABS: 5.4 10*3/uL (ref 1.4–7.0)
NEUTROS PCT: 59 %
RBC: 5.13 x10E6/uL (ref 3.77–5.28)
RDW: 13.6 % (ref 12.3–15.4)
WBC: 9.3 10*3/uL (ref 3.4–10.8)

## 2017-08-30 LAB — LIPID PANEL WITH LDL/HDL RATIO
Cholesterol, Total: 181 mg/dL (ref 100–199)
HDL: 41 mg/dL (ref >39)
LDL Calculated: 127 mg/dL — ABNORMAL HIGH (ref 0–99)
LDl/HDL Ratio: 3.1 ratio (ref 0.0–3.2)
Triglycerides: 67 mg/dL (ref 0–149)
VLDL Cholesterol Cal: 13 mg/dL (ref 5–40)

## 2017-08-30 LAB — HEMOGLOBIN A1C
ESTIMATED AVERAGE GLUCOSE: 286 mg/dL
Hgb A1c MFr Bld: 11.6 % — ABNORMAL HIGH (ref 4.8–5.6)

## 2017-08-30 LAB — TSH: TSH: 4.15 u[IU]/mL (ref 0.450–4.500)

## 2017-08-30 LAB — T3: T3 TOTAL: 125 ng/dL (ref 71–180)

## 2017-08-30 LAB — T4, FREE: Free T4: 1.02 ng/dL (ref 0.82–1.77)

## 2017-08-30 LAB — VITAMIN D 25 HYDROXY (VIT D DEFICIENCY, FRACTURES): Vit D, 25-Hydroxy: 10.7 ng/mL — ABNORMAL LOW (ref 30.0–100.0)

## 2017-08-30 LAB — INSULIN, RANDOM: INSULIN: 30.6 u[IU]/mL — AB (ref 2.6–24.9)

## 2017-09-12 ENCOUNTER — Encounter (INDEPENDENT_AMBULATORY_CARE_PROVIDER_SITE_OTHER): Payer: Self-pay

## 2017-09-12 ENCOUNTER — Ambulatory Visit (INDEPENDENT_AMBULATORY_CARE_PROVIDER_SITE_OTHER): Payer: Self-pay | Admitting: Family Medicine

## 2017-09-21 ENCOUNTER — Other Ambulatory Visit: Payer: Self-pay

## 2017-09-21 ENCOUNTER — Ambulatory Visit (INDEPENDENT_AMBULATORY_CARE_PROVIDER_SITE_OTHER): Payer: Self-pay | Admitting: Family Medicine

## 2017-09-21 ENCOUNTER — Encounter: Payer: Self-pay | Admitting: Family Medicine

## 2017-09-21 ENCOUNTER — Ambulatory Visit: Payer: Self-pay | Admitting: Family Medicine

## 2017-09-21 DIAGNOSIS — Z3009 Encounter for other general counseling and advice on contraception: Secondary | ICD-10-CM

## 2017-09-21 NOTE — Patient Instructions (Addendum)
Thank you for coming in today, it was so nice to see you! Today we talked about:    Birth control: You will need to have your IUD out and a nexplanon placed . Please stop by the front desk and schedule an appointment at the gynecology clinic here. You will need an hour long appointment   If you have any questions or concerns, please do not hesitate to call the office at [phone]. You can also message me directly via MyChart.   Sincerely,  Anders Simmondshristina Areesha Dehaven, MD

## 2017-09-21 NOTE — Assessment & Plan Note (Signed)
Discussed other birth control options with patient today.  She would like a Nexplanon placed and her IUD removed. Of note, she has uncontrolled diabetes, tried to discuss that at today's visit but patient stated "I'm not talking about that today".  - Follow up for IUD removal and Nexplanon placement within the next couple weeks
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
## 2017-10-06 ENCOUNTER — Ambulatory Visit: Payer: Medicaid Other | Admitting: Family Medicine

## 2018-04-03 ENCOUNTER — Telehealth: Payer: Self-pay | Admitting: Family Medicine

## 2018-04-03 NOTE — Telephone Encounter (Signed)
Patient did not answer. Left voicemail. Patient needs appointment for diabetes management with PCP.

## 2018-05-11 ENCOUNTER — Other Ambulatory Visit: Payer: Self-pay

## 2018-05-11 ENCOUNTER — Emergency Department (HOSPITAL_COMMUNITY)
Admission: EM | Admit: 2018-05-11 | Discharge: 2018-05-12 | Disposition: A | Discharge status: Home / Self Care | Payer: Medicaid Other | Attending: Emergency Medicine | Admitting: Emergency Medicine

## 2018-05-11 ENCOUNTER — Encounter (HOSPITAL_COMMUNITY): Payer: Self-pay | Admitting: Emergency Medicine

## 2018-05-11 DIAGNOSIS — R1031 Right lower quadrant pain: Secondary | ICD-10-CM | POA: Insufficient documentation

## 2018-05-11 DIAGNOSIS — E039 Hypothyroidism, unspecified: Secondary | ICD-10-CM | POA: Insufficient documentation

## 2018-05-11 DIAGNOSIS — I10 Essential (primary) hypertension: Secondary | ICD-10-CM | POA: Insufficient documentation

## 2018-05-11 DIAGNOSIS — E119 Type 2 diabetes mellitus without complications: Secondary | ICD-10-CM | POA: Insufficient documentation

## 2018-05-11 DIAGNOSIS — R1084 Generalized abdominal pain: Secondary | ICD-10-CM

## 2018-05-11 LAB — CBC
HCT: 43.7 % (ref 36.0–46.0)
Hemoglobin: 13.5 g/dL (ref 12.0–15.0)
MCH: 24.5 pg — ABNORMAL LOW (ref 26.0–34.0)
MCHC: 30.9 g/dL (ref 30.0–36.0)
MCV: 79.5 fL — AB (ref 80.0–100.0)
NRBC: 0 % (ref 0.0–0.2)
PLATELETS: 395 10*3/uL (ref 150–400)
RBC: 5.5 MIL/uL — AB (ref 3.87–5.11)
RDW: 13.2 % (ref 11.5–15.5)
WBC: 9.4 10*3/uL (ref 4.0–10.5)

## 2018-05-11 LAB — I-STAT BETA HCG BLOOD, ED (MC, WL, AP ONLY): I-stat hCG, quantitative: <5 m[IU]/mL (ref <5)

## 2018-05-11 MED ORDER — OXYCODONE-ACETAMINOPHEN 5-325 MG PO TABS
1.0000 | ORAL_TABLET | Freq: Once | ORAL | Status: AC
  Administered 2018-05-11: 1 via ORAL
  Filled 2018-05-11: qty 1

## 2018-05-11 NOTE — ED Triage Notes (Signed)
Pt to ED with c/o RLQ pain.  Onset yesterday, worse today.  Nausea without vomiting.  Denies urinary symptoms or vag. discharge

## 2018-05-12 ENCOUNTER — Emergency Department (HOSPITAL_COMMUNITY): Payer: Medicaid Other

## 2018-05-12 LAB — LIPASE, BLOOD: Lipase: 40 U/L (ref 11–51)

## 2018-05-12 LAB — COMPREHENSIVE METABOLIC PANEL
ALBUMIN: 3.7 g/dL (ref 3.5–5.0)
ALK PHOS: 83 U/L (ref 38–126)
ALT: 54 U/L — ABNORMAL HIGH (ref 0–44)
ANION GAP: 16 — AB (ref 5–15)
AST: 55 U/L — AB (ref 15–41)
BUN: 7 mg/dL (ref 6–20)
CALCIUM: 10.2 mg/dL (ref 8.9–10.3)
CO2: 19 mmol/L — AB (ref 22–32)
Chloride: 101 mmol/L (ref 98–111)
Creatinine, Ser: 0.72 mg/dL (ref 0.44–1.00)
GFR calc Af Amer: >60 mL/min (ref >60)
GFR calc non Af Amer: >60 mL/min (ref >60)
GLUCOSE: 410 mg/dL — AB (ref 70–99)
Potassium: 4.2 mmol/L (ref 3.5–5.1)
SODIUM: 136 mmol/L (ref 135–145)
Total Bilirubin: 0.6 mg/dL (ref 0.3–1.2)
Total Protein: 7.9 g/dL (ref 6.5–8.1)

## 2018-05-12 LAB — URINALYSIS, ROUTINE W REFLEX MICROSCOPIC
Bilirubin Urine: NEGATIVE
Glucose, UA: >=500 mg/dL — AB
Hgb urine dipstick: NEGATIVE
KETONES UR: 20 mg/dL — AB
Nitrite: NEGATIVE
PH: 7 (ref 5.0–8.0)
Protein, ur: NEGATIVE mg/dL
Specific Gravity, Urine: 1.02 (ref 1.005–1.030)

## 2018-05-12 MED ORDER — HYDROCODONE-ACETAMINOPHEN 5-325 MG PO TABS: 1.0000 | ORAL_TABLET | Freq: Four times a day (QID) | ORAL | 0 refills | Status: DC | PRN

## 2018-05-12 MED ORDER — MORPHINE SULFATE (PF) 4 MG/ML IV SOLN
4.0000 mg | Freq: Once | INTRAVENOUS | Status: AC
  Administered 2018-05-12: 4 mg via INTRAVENOUS
  Filled 2018-05-12: qty 1

## 2018-05-12 MED ORDER — SODIUM CHLORIDE 0.9 % IV BOLUS
1000.0000 mL | Freq: Once | INTRAVENOUS | Status: AC
  Administered 2018-05-12: 1000 mL via INTRAVENOUS

## 2018-05-12 MED ORDER — ONDANSETRON HCL 4 MG/2ML IJ SOLN
4.0000 mg | Freq: Once | INTRAMUSCULAR | Status: AC
  Administered 2018-05-12: 4 mg via INTRAVENOUS
  Filled 2018-05-12: qty 2

## 2018-05-12 NOTE — ED Notes (Signed)
Pt discharged from ED; instructions provided and scripts given; Pt encouraged to return to ED if symptoms worsen and to f/u with PCP; Pt verbalized understanding of all instructions 
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
## 2018-05-12 NOTE — Discharge Instructions (Addendum)
Hydrocodone is prescribed as needed for pain.  Follow-up with your primary doctor if symptoms are not improving, and return to the ER if symptoms significantly worsen or change.

## 2018-05-12 NOTE — ED Notes (Signed)
Patient transported to CT 

## 2018-05-31 ENCOUNTER — Other Ambulatory Visit (HOSPITAL_COMMUNITY)
Admission: RE | Admit: 2018-05-31 | Discharge: 2018-05-31 | Disposition: A | Discharge status: Home / Self Care | Payer: Medicaid Other | Source: Ambulatory Visit | Attending: Family Medicine | Admitting: Family Medicine

## 2018-05-31 ENCOUNTER — Ambulatory Visit (INDEPENDENT_AMBULATORY_CARE_PROVIDER_SITE_OTHER): Payer: Self-pay | Admitting: Family Medicine

## 2018-05-31 ENCOUNTER — Other Ambulatory Visit: Payer: Self-pay

## 2018-05-31 VITALS — BP 120/80 | HR 75 | Temp 98.0°F | Wt 225.0 lb

## 2018-05-31 DIAGNOSIS — Z3202 Encounter for pregnancy test, result negative: Secondary | ICD-10-CM

## 2018-05-31 DIAGNOSIS — Z113 Encounter for screening for infections with a predominantly sexual mode of transmission: Secondary | ICD-10-CM

## 2018-05-31 DIAGNOSIS — N898 Other specified noninflammatory disorders of vagina: Secondary | ICD-10-CM | POA: Diagnosis not present

## 2018-05-31 DIAGNOSIS — N912 Amenorrhea, unspecified: Secondary | ICD-10-CM

## 2018-05-31 LAB — POCT WET PREP (WET MOUNT)
CLUE CELLS WET PREP WHIFF POC: NEGATIVE
TRICHOMONAS WET PREP HPF POC: ABSENT

## 2018-05-31 LAB — POCT URINE PREGNANCY: PREG TEST UR: NEGATIVE

## 2018-05-31 MED ORDER — FLUCONAZOLE 150 MG PO TABS: 150.0000 mg | ORAL_TABLET | Freq: Once | ORAL | 0 refills | Status: AC

## 2018-05-31 NOTE — Progress Notes (Signed)
    Subjective:  Shelley Payne is a 22 y.o. female who presents to the [facility] today with a chief complaint of vaginal discharge.   HPI:  Vaginal discharge and cramping. Has had multiple partners.  No purulence or dysuria. Does have the skyla since March 2016, she wants it swapped out but does not have insurance so wants to wait until she can get the new one covered.    ROS: Per HPI  Objective:  Physical Exam: BP 120/80   Pulse 75   Temp 98 F (36.7 C) (Oral)   Wt 225 lb (102.1 kg)   LMP 03/26/2018   SpO2 97%   BMI 41.15 kg/m   Gen: NAD, resting comfortably Pulm: NWOB Pelvic exam: normal external genitalia, vulva, vagina, cervix with string visualized. No CMT Skin: warm, dry Neuro: grossly normal, moves all extremities Psych: Normal affect and thought content  Results for orders placed or performed in visit on 05/31/18 (from the past 72 hour(s))  POCT urine pregnancy     Status: None   Collection Time: 05/31/18  3:15 PM  Result Value Ref Range   Preg Test, Ur Negative Negative  POCT Wet Prep Mellody Drown [place])     Status: Abnormal   Collection Time: 05/31/18  3:30 PM  Result Value Ref Range   Source Wet Prep POC VAG    WBC, Wet Prep HPF POC 0-3    Bacteria Wet Prep HPF POC Few Few   Clue Cells Wet Prep HPF POC None None   Clue Cells Wet Prep Whiff POC Negative Whiff    Yeast Wet Prep HPF POC Few (A) None   Trichomonas Wet Prep HPF POC Absent Absent     Assessment/Plan:  1. Vaginal discharge Wet prep consistent with yeast.  Treat with Diflucan. - POCT Wet Prep ([place]) - fluconazole (DIFLUCAN) 150 MG tablet; Take 1 tablet (150 mg total) by mouth once for 1 dose.  Dispense: 1 tablet; Refill: 0  2. Screening for STD (sexually transmitted disease) STD testing collected today.  No cervical motion tenderness which is reassuring. - Cervicovaginal ancillary only - HIV antibody (with reflex) - RPR  3. Amenorrhea Urine pregnancy is negative.  Patient was due for IUD  replacement in March 2019.  She is informed that her family-planning Medicaid would cover this that she should schedule an appointment. - POCT urine pregnancy  Patient will need well woman visit at her next appointment.  Will inform PCP  Leland Her, DO PGY-3, [facility] 05/31/2018 3:27 PM

## 2018-05-31 NOTE — Patient Instructions (Addendum)
Please make an appointment to have your IUD changed out.  Take diflucan for your yeast infection.  We are checking some labs today. If results require attention, either myself or my nurse will get in touch with you. If everything is normal, you will get a letter in the mail or a message in My Chart. Please give [place] a call if you do not hear from [place] after 2 weeks.   Feel free to call with any questions or concerns at any time, at [phone].   Take care,  Dr. Leland Her, DO [facility]   Vaginal Yeast infection, Adult Vaginal yeast infection is a condition that causes soreness, swelling, and redness (inflammation) of the vagina. It also causes vaginal discharge. This is a common condition. Some women get this infection frequently. What are the causes? This condition is caused by a change in the normal balance of the yeast (candida) and bacteria that live in the vagina. This change causes an overgrowth of yeast, which causes the inflammation. What increases the risk? This condition is more likely to develop in:  Women who take antibiotic medicines.  Women who have diabetes.  Women who take birth control pills.  Women who are pregnant.  Women who douche often.  Women who have a weak defense (immune) system.  Women who have been taking steroid medicines for a long time.  Women who frequently wear tight clothing.  What are the signs or symptoms? Symptoms of this condition include:  White, thick vaginal discharge.  Swelling, itching, redness, and irritation of the vagina. The lips of the vagina (vulva) may be affected as well.  Pain or a burning feeling while urinating.  Pain during sex.  How is this diagnosed? This condition is diagnosed with a medical history and physical exam. This will include a pelvic exam. Your health care provider will examine a sample of your vaginal discharge under a microscope. Your health care provider may send this sample for  testing to confirm the diagnosis. How is this treated? This condition is treated with medicine. Medicines may be over-the-counter or prescription. You may be told to use one or more of the following:  Medicine that is taken orally.  Medicine that is applied as a cream.  Medicine that is inserted directly into the vagina (suppository).  Follow these instructions at home:  Take or apply over-the-counter and prescription medicines only as told by your health care provider.  Do not have sex until your health care provider has approved. Tell your sex partner that you have a yeast infection. That person should go to his or her health care provider if he or she develops symptoms.  Do not wear tight clothes, such as pantyhose or tight pants.  Avoid using tampons until your health care provider approves.  Eat more yogurt. This may help to keep your yeast infection from returning.  Try taking a sitz bath to help with discomfort. This is a warm water bath that is taken while you are sitting down. The water should only come up to your hips and should cover your buttocks. Do this 3-4 times per day or as told by your health care provider.  Do not douche.  Wear breathable, cotton underwear.  If you have diabetes, keep your blood sugar levels under control. Contact a health care provider if:  You have a fever.  Your symptoms go away and then return.  Your symptoms do not get better with treatment.  Your symptoms get  worse.  You have new symptoms.  You develop blisters in or around your vagina.  You have blood coming from your vagina and it is not your menstrual period.  You develop pain in your abdomen. This information is not intended to replace advice given to you by your health care provider. Make sure you discuss any questions you have with your health care provider. Document Released: 04/21/2005 Document Revised: 12/24/2015 Document Reviewed: 01/13/2015 Elsevier Interactive  Patient Education  2018 [employer].

## 2018-06-01 LAB — CERVICOVAGINAL ANCILLARY ONLY
Chlamydia: NEGATIVE
Neisseria Gonorrhea: NEGATIVE

## 2018-06-02 ENCOUNTER — Encounter: Payer: Self-pay | Admitting: Family Medicine

## 2018-06-02 LAB — RPR: RPR Ser Ql: REACTIVE — AB

## 2018-06-02 LAB — RPR, QUANT+TP ABS (REFLEX): T Pallidum Abs: NEGATIVE

## 2018-06-02 LAB — HIV ANTIBODY (ROUTINE TESTING W REFLEX): HIV SCREEN 4TH GENERATION: NONREACTIVE

## 2018-06-08 ENCOUNTER — Encounter: Payer: Self-pay | Admitting: Family Medicine

## 2018-06-13 ENCOUNTER — Encounter: Payer: Self-pay | Admitting: Family Medicine

## 2018-07-05 ENCOUNTER — Ambulatory Visit: Payer: Medicaid Other | Admitting: Family Medicine

## 2018-08-30 ENCOUNTER — Ambulatory Visit (INDEPENDENT_AMBULATORY_CARE_PROVIDER_SITE_OTHER): Payer: Self-pay | Admitting: Family Medicine

## 2018-08-30 ENCOUNTER — Encounter: Payer: Self-pay | Admitting: Family Medicine

## 2018-08-30 VITALS — BP 105/70 | HR 87 | Temp 98.0°F | Wt 220.0 lb

## 2018-08-30 DIAGNOSIS — E1165 Type 2 diabetes mellitus with hyperglycemia: Secondary | ICD-10-CM

## 2018-08-30 DIAGNOSIS — Z30013 Encounter for initial prescription of injectable contraceptive: Secondary | ICD-10-CM

## 2018-08-30 DIAGNOSIS — Z975 Presence of (intrauterine) contraceptive device: Secondary | ICD-10-CM | POA: Insufficient documentation

## 2018-08-30 LAB — POCT GLYCOSYLATED HEMOGLOBIN (HGB A1C): HbA1c, POC (controlled diabetic range): 10.5 % — AB (ref 0.0–7.0)

## 2018-08-30 LAB — POCT URINE PREGNANCY: Preg Test, Ur: NEGATIVE

## 2018-08-30 MED ORDER — MEDROXYPROGESTERONE ACETATE 150 MG/ML IM SUSP
150.0000 mg | Freq: Once | INTRAMUSCULAR | Status: AC
  Administered 2018-08-30: 150 mg via INTRAMUSCULAR

## 2018-08-30 NOTE — Progress Notes (Signed)
   Subjective:   Patient ID: Shelley Payne    DOB: 01-22-1996, 23 y.o. female   MRN: [id number]  CC: contraception management, f/u T2DM  HPI: Shelley Payne is a 23 y.o. female who presents to clinic today for contraception management and f/u T2DM.  Contraception management  Skyla placed in 2016, is now expired since March 2019.    Was seen in 2017 for malpositioned IUD, and referred to [facility].  She went and was told that it has shifted but was ok to keep in.   Currently sexually active with her boyfriend. They do not use condoms or any secondary form of contraception.   No vaginal discharge, abnormal bleeding or pelvic pain.   No fever, chills, nausea, vomiting.    T2DM, uncontrolled  Medications: has been prescribed medications in the past but has not been taking  Compliance: not taking, she states she does not feel this is an issue and does not wish to take any medications  CBG checks: no  Diet: high in carbohydrates but otherwise feels it is well balanced and does not wish to discuss Exercise: no  Symptoms of hypoglycemia: no Foot exam: declined Eye exam: no  ROS: See HPI for pertinent ROS.  Social: pt is a never smoker.  Medications reviewed. Objective:   BP 105/70   Pulse 87   Temp 98 F (36.7 C) (Oral)   Wt 220 lb (99.8 kg)   LMP 08/22/2018   SpO2 98%   BMI 40.24 kg/m  Vitals and nursing note reviewed.  General: 23 yo female, NAD  Neck: supple  CV: RRR no MRG  Lungs: CTAB, normal effort   Abdomen: soft, NTND, +bs  Pelvic exam: VULVA: normal appearing vulva with no masses, tenderness or lesions, VAGINA: normal appearing vagina with normal color and discharge, no lesions, CERVIX: normal appearing cervix without discharge or lesions. Skin: warm, dry, no rash Extremities: warm and well perfused  Neuro: alert, oriented x3, no focal deficits   Assessment & Plan:   Type 2 diabetes mellitus in patient age 32-19 years with HbA1C goal below  7.5 Last A1c 11.6 on 08/29/2017, per chart review has always been uncontrolled >11  POC A1c rechecked today and is 10.5.   Discussed importance of good blood sugar control at length for further education, however she declines further discussion and does not feel her diabetes is an issue.   Not taking medications, will revisit the topic at a future date.   IUD contraception Expired Skyla removed 08/30/2018, negative upreg confirmed and Depo shot given today.  Will need to return for IUD placement, scheduled in colposcopy clinic  Counseled on safe sex  Orders Placed This Encounter  Procedures  . HgB A1c  . POCT urine pregnancy   Freddrick March, MD [facility] PGY-3

## 2018-08-30 NOTE — Assessment & Plan Note (Addendum)
Last A1c 11.6 on 08/29/2017, per chart review has always been uncontrolled >11  POC A1c rechecked today and is 10.5.   Discussed importance of good blood sugar control at length for further education, however she declines further discussion and does not feel her diabetes is an issue.   Not taking medications, will revisit the topic at a future date.

## 2018-08-30 NOTE — Assessment & Plan Note (Signed)
Expired Skyla removed 08/30/2018, negative upreg confirmed and Depo shot given today.  Will need to return for IUD placement, scheduled in colposcopy clinic  Counseled on safe sex

## 2018-08-30 NOTE — Patient Instructions (Signed)
You were seen in clinic today for contraception management.  We removed your Skyla IUD and your pregnancy test was negative.  You were given a Depo-Provera injection today and will be scheduled for a IUD placement in our colposcopy clinic.   Please call clinic if you have any questions.

## 2018-08-30 NOTE — Progress Notes (Signed)
Pt presents for IUD removal. Pt is scheduled for colpo clinic in the coming weeks. Depo Provera was given today for back up birth control. Injection given RUOQ, site unremarkable. Reminder card given for IUD insertion, pt desires Skyla.

## 2018-09-04 ENCOUNTER — Other Ambulatory Visit: Payer: Self-pay

## 2018-09-04 ENCOUNTER — Emergency Department (HOSPITAL_COMMUNITY): Payer: Self-pay

## 2018-09-04 ENCOUNTER — Emergency Department (HOSPITAL_COMMUNITY)
Admission: EM | Admit: 2018-09-04 | Discharge: 2018-09-04 | Disposition: A | Discharge status: Home / Self Care | Payer: Self-pay | Attending: Emergency Medicine | Admitting: Emergency Medicine

## 2018-09-04 DIAGNOSIS — E119 Type 2 diabetes mellitus without complications: Secondary | ICD-10-CM | POA: Insufficient documentation

## 2018-09-04 DIAGNOSIS — R55 Syncope and collapse: Secondary | ICD-10-CM | POA: Insufficient documentation

## 2018-09-04 DIAGNOSIS — F419 Anxiety disorder, unspecified: Secondary | ICD-10-CM | POA: Insufficient documentation

## 2018-09-04 DIAGNOSIS — R400 Somnolence: Secondary | ICD-10-CM | POA: Insufficient documentation

## 2018-09-04 DIAGNOSIS — J45909 Unspecified asthma, uncomplicated: Secondary | ICD-10-CM | POA: Insufficient documentation

## 2018-09-04 DIAGNOSIS — E039 Hypothyroidism, unspecified: Secondary | ICD-10-CM | POA: Insufficient documentation

## 2018-09-04 DIAGNOSIS — I10 Essential (primary) hypertension: Secondary | ICD-10-CM | POA: Insufficient documentation

## 2018-09-04 DIAGNOSIS — R251 Tremor, unspecified: Secondary | ICD-10-CM | POA: Insufficient documentation

## 2018-09-04 DIAGNOSIS — F22 Delusional disorders: Secondary | ICD-10-CM | POA: Insufficient documentation

## 2018-09-04 LAB — URINALYSIS, ROUTINE W REFLEX MICROSCOPIC
BILIRUBIN URINE: NEGATIVE
Glucose, UA: 150 mg/dL — AB
Hgb urine dipstick: NEGATIVE
Ketones, ur: 20 mg/dL — AB
NITRITE: NEGATIVE
Protein, ur: 100 mg/dL — AB
SPECIFIC GRAVITY, URINE: 1.023 (ref 1.005–1.030)
Trans Epithel, UA: 1
WBC, UA: >50 WBC/hpf — ABNORMAL HIGH (ref 0–5)
pH: 5 (ref 5.0–8.0)

## 2018-09-04 LAB — COMPREHENSIVE METABOLIC PANEL
ALT: 50 U/L — ABNORMAL HIGH (ref 0–44)
AST: 57 U/L — ABNORMAL HIGH (ref 15–41)
Albumin: 3.9 g/dL (ref 3.5–5.0)
Alkaline Phosphatase: 63 U/L (ref 38–126)
Anion gap: 14 (ref 5–15)
BILIRUBIN TOTAL: 0.8 mg/dL (ref 0.3–1.2)
BUN: 10 mg/dL (ref 6–20)
CO2: 23 mmol/L (ref 22–32)
Calcium: 9.7 mg/dL (ref 8.9–10.3)
Chloride: 99 mmol/L (ref 98–111)
Creatinine, Ser: 0.66 mg/dL (ref 0.44–1.00)
GFR calc Af Amer: >60 mL/min (ref >60)
GFR calc non Af Amer: >60 mL/min (ref >60)
Glucose, Bld: 312 mg/dL — ABNORMAL HIGH (ref 70–99)
POTASSIUM: 4 mmol/L (ref 3.5–5.1)
Sodium: 136 mmol/L (ref 135–145)
Total Protein: 7.6 g/dL (ref 6.5–8.1)

## 2018-09-04 LAB — CBC WITH DIFFERENTIAL/PLATELET
ABS IMMATURE GRANULOCYTES: 0.05 10*3/uL (ref 0.00–0.07)
Basophils Absolute: 0 10*3/uL (ref 0.0–0.1)
Basophils Relative: 0 %
Eosinophils Absolute: 0.4 10*3/uL (ref 0.0–0.5)
Eosinophils Relative: 4 %
HCT: 43.8 % (ref 36.0–46.0)
HEMOGLOBIN: 13.7 g/dL (ref 12.0–15.0)
Immature Granulocytes: 1 %
Lymphocytes Relative: 39 %
Lymphs Abs: 3.8 10*3/uL (ref 0.7–4.0)
MCH: 25.5 pg — ABNORMAL LOW (ref 26.0–34.0)
MCHC: 31.3 g/dL (ref 30.0–36.0)
MCV: 81.4 fL (ref 80.0–100.0)
Monocytes Absolute: 0.6 10*3/uL (ref 0.1–1.0)
Monocytes Relative: 7 %
NEUTROS ABS: 4.8 10*3/uL (ref 1.7–7.7)
Neutrophils Relative %: 49 %
Platelets: 334 10*3/uL (ref 150–400)
RBC: 5.38 MIL/uL — ABNORMAL HIGH (ref 3.87–5.11)
RDW: 14.1 % (ref 11.5–15.5)
WBC: 9.7 10*3/uL (ref 4.0–10.5)
nRBC: 0 % (ref 0.0–0.2)

## 2018-09-04 LAB — CBG MONITORING, ED: Glucose-Capillary: 283 mg/dL — ABNORMAL HIGH (ref 70–99)

## 2018-09-04 LAB — RAPID URINE DRUG SCREEN, HOSP PERFORMED
Amphetamines: NOT DETECTED
Barbiturates: NOT DETECTED
Benzodiazepines: NOT DETECTED
Cocaine: NOT DETECTED
Opiates: NOT DETECTED
TETRAHYDROCANNABINOL: NOT DETECTED

## 2018-09-04 LAB — AMMONIA: Ammonia: 25 umol/L (ref 9–35)

## 2018-09-04 LAB — POC URINE PREG, ED: Preg Test, Ur: NEGATIVE

## 2018-09-04 LAB — INFLUENZA PANEL BY PCR (TYPE A & B)
INFLBPCR: NEGATIVE
Influenza A By PCR: NEGATIVE

## 2018-09-04 MED ORDER — NALOXONE HCL 0.4 MG/ML IJ SOLN
0.4000 mg | Freq: Once | INTRAMUSCULAR | Status: AC
  Administered 2018-09-04: 0.4 mg via INTRAVENOUS

## 2018-09-04 MED ORDER — CEPHALEXIN 500 MG PO CAPS: 500.0000 mg | ORAL_CAPSULE | Freq: Two times a day (BID) | ORAL | 0 refills | Status: AC

## 2018-09-04 NOTE — ED Notes (Addendum)
Narcan administered without effect. EDP aware.

## 2018-09-04 NOTE — ED Notes (Signed)
Patient verbalizes understanding of discharge instructions. Opportunity for questioning and answers were provided. Armband removed by staff, pt discharged from ED.  

## 2018-09-04 NOTE — ED Triage Notes (Addendum)
Pt BIB rapid response after being found passed out in bathroom by fellow employees at [employer] within hospital. Pt was slumped over on commode, no trauma noted. Pt was lethargic upon arrival to ED. Responsive to painful stimuli only. ABCs intact. Vs within normal limits.
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
## 2018-09-04 NOTE — ED Notes (Signed)
Called by hospital staff to women's bathroom next to [facility].  Upon my arrival she was sitting on the floor next to the toilet, not responding staff.  Per report a coworker checked on her and crawled under and opened the door.  There was some report of her "shaking" and being clammy.  I was able to assist her to stand and pivot to sit in a wheelchair.   She was able to sit in the wheelchair unassisted, she did not slump.  But remained "unresponsive".  Upon arrival to the ED stood and pivot to stretcher.  Bp 132/92 hr 88  rr 24  O2SAT 100% on ra  CBG 290.  ED rn started IV, EDP at bedside to assess her.  Handoff with ED staff.

## 2018-09-04 NOTE — Discharge Instructions (Addendum)
Please ensure that you do not drive a motor vehicle or operate heavy machinery until assessed by your PCP, neurology.

## 2018-09-06 LAB — URINE CULTURE

## 2018-09-14 ENCOUNTER — Ambulatory Visit: Payer: Medicaid Other

## 2018-11-16 ENCOUNTER — Ambulatory Visit (INDEPENDENT_AMBULATORY_CARE_PROVIDER_SITE_OTHER): Payer: Self-pay

## 2018-11-16 ENCOUNTER — Other Ambulatory Visit: Payer: Self-pay

## 2018-11-16 DIAGNOSIS — Z3042 Encounter for surveillance of injectable contraceptive: Secondary | ICD-10-CM

## 2018-11-16 MED ORDER — MEDROXYPROGESTERONE ACETATE 150 MG/ML IM SUSP
150.0000 mg | Freq: Once | INTRAMUSCULAR | Status: AC
  Administered 2018-11-16: 150 mg via INTRAMUSCULAR

## 2018-11-16 NOTE — Progress Notes (Signed)
Pt presents in nurse clinic for depo injection. Pt is within dates. Injection given LUOQ, site unremarkable. Patient due back for next injection, 7/9-7/23, reminder card given.

## 2019-02-01 ENCOUNTER — Other Ambulatory Visit: Payer: Self-pay

## 2019-02-01 ENCOUNTER — Ambulatory Visit (INDEPENDENT_AMBULATORY_CARE_PROVIDER_SITE_OTHER): Payer: Self-pay

## 2019-02-01 DIAGNOSIS — Z3009 Encounter for other general counseling and advice on contraception: Secondary | ICD-10-CM

## 2019-02-01 DIAGNOSIS — Z3042 Encounter for surveillance of injectable contraceptive: Secondary | ICD-10-CM

## 2019-02-01 MED ORDER — MEDROXYPROGESTERONE ACETATE 150 MG/ML IM SUSP
150.0000 mg | Freq: Once | INTRAMUSCULAR | Status: AC
  Administered 2019-02-01: 150 mg via INTRAMUSCULAR

## 2019-02-01 NOTE — Progress Notes (Signed)
Patient here today for Depo Provera injection and is within her dates.    Depo given in ROQ today.  Site unremarkable & patient tolerated injection.    Next injection due Sep24-Oct 8.  Reminder card given.    Ottis Stain, CMA

## 2019-05-09 ENCOUNTER — Other Ambulatory Visit: Payer: Self-pay | Admitting: Family Medicine

## 2019-05-09 ENCOUNTER — Ambulatory Visit (INDEPENDENT_AMBULATORY_CARE_PROVIDER_SITE_OTHER): Payer: Medicaid Other | Admitting: Family Medicine

## 2019-05-09 ENCOUNTER — Other Ambulatory Visit: Payer: Self-pay

## 2019-05-09 ENCOUNTER — Other Ambulatory Visit (HOSPITAL_COMMUNITY)
Admission: RE | Admit: 2019-05-09 | Discharge: 2019-05-09 | Disposition: A | Discharge status: Home / Self Care | Payer: Medicaid Other | Source: Ambulatory Visit | Attending: Family Medicine | Admitting: Family Medicine

## 2019-05-09 VITALS — BP 110/84 | HR 94 | Wt 215.6 lb

## 2019-05-09 DIAGNOSIS — E063 Autoimmune thyroiditis: Secondary | ICD-10-CM

## 2019-05-09 DIAGNOSIS — E1165 Type 2 diabetes mellitus with hyperglycemia: Secondary | ICD-10-CM | POA: Diagnosis present

## 2019-05-09 DIAGNOSIS — E039 Hypothyroidism, unspecified: Secondary | ICD-10-CM

## 2019-05-09 DIAGNOSIS — Z124 Encounter for screening for malignant neoplasm of cervix: Secondary | ICD-10-CM | POA: Insufficient documentation

## 2019-05-09 DIAGNOSIS — N939 Abnormal uterine and vaginal bleeding, unspecified: Secondary | ICD-10-CM | POA: Diagnosis not present

## 2019-05-09 DIAGNOSIS — Z113 Encounter for screening for infections with a predominantly sexual mode of transmission: Secondary | ICD-10-CM

## 2019-05-09 DIAGNOSIS — Z3009 Encounter for other general counseling and advice on contraception: Secondary | ICD-10-CM

## 2019-05-09 LAB — POCT WET PREP (WET MOUNT): Clue Cells Wet Prep Whiff POC: NEGATIVE

## 2019-05-09 LAB — POCT GLYCOSYLATED HEMOGLOBIN (HGB A1C): HbA1c, POC (controlled diabetic range): 11 % — AB (ref 0.0–7.0)

## 2019-05-09 LAB — POCT URINE PREGNANCY: Preg Test, Ur: NEGATIVE

## 2019-05-09 MED ORDER — MEDROXYPROGESTERONE ACETATE 150 MG/ML IM SUSP
150.0000 mg | Freq: Once | INTRAMUSCULAR | Status: AC
  Administered 2019-05-09: 150 mg via INTRAMUSCULAR

## 2019-05-09 MED ORDER — METRONIDAZOLE 500 MG PO TABS: 500.0000 mg | ORAL_TABLET | Freq: Two times a day (BID) | ORAL | 0 refills | Status: DC

## 2019-05-09 MED ORDER — MEDROXYPROGESTERONE ACETATE 150 MG/ML IM SUSP: 150.0000 mg | Freq: Once | INTRAMUSCULAR | Status: DC

## 2019-05-09 NOTE — Assessment & Plan Note (Signed)
Will obtain TSH today.  Symptoms of hypothyroidism and anemia can often be similar, and she likely has too little thyroid hormone due to being nonadherent with her Synthroid.

## 2019-05-09 NOTE — Assessment & Plan Note (Signed)
Counseled patient that I am very worried about her A1c being consistently elevated.  I told her that over time, she is at high risk for developing kidney disease, neuropathy, and retinopathy as well as heart problems due to her diabetes.  Strongly encouraged her to see her PCP to continue discussing this and told her that she would benefit greatly from lifestyle changes and starting medications.  She seems to be overwhelmed and possibly in denial of this diagnosis.
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
## 2019-05-09 NOTE — Assessment & Plan Note (Addendum)
Pelvic exam was consistent with a uterine source of her bleeding.  Will obtain CBC, iron studies, and transvaginal ultrasound to further investigate this.  We will hold off on medications for her bleeding until CBC returns.  Options for other birth control methods were offered to patient, and she elected to continue with Depo-Provera for now.  Due to patient's hirsutism, type 2 diabetes and irregular bleeding in the past, she may have PCOS.

## 2019-05-09 NOTE — Patient Instructions (Addendum)
It was nice meeting you today Ms. Shelley Payne!  Today, we performed a pelvic exam and did your Pap smear and STI testing.  We will let you know what these results are when they return.  We are scheduling your ultrasound for further investigation of your bleeding.  We are also doing labs to check your thyroid hormone and to make sure you are not anemic.  I will let you know what these results are when they return as well.  We also provided you with your Depo-Provera shot.  Your hemoglobin A1c today is elevated to 11.0, which means that your sugars are much higher than they should be.  If this is not treated, it will cause heart problems, kidney disease, nerve disease, and eye disease.  I highly recommend that you make an appointment with your PCP here to discuss what lifestyle changes and medications we can do to prevent these problems from occurring.  Please make that appointment as soon as possible.  If you have any questions or concerns, please feel free to call the clinic.   Be well,  Dr. Shan Levans

## 2019-05-09 NOTE — Assessment & Plan Note (Signed)
Pregnancy test was negative today.  Provided with Depo-Provera after counseling was done.

## 2019-05-10 ENCOUNTER — Telehealth: Payer: Self-pay

## 2019-05-10 NOTE — Telephone Encounter (Signed)
Pt informed. Shelley Payne, CMA  

## 2019-05-10 NOTE — Telephone Encounter (Signed)
-----   Message from Kathrene Alu, MD sent at 05/10/2019  4:55 PM EDT ----- Would you call Ms. Lykins and let her know that her blood count was normal?  Thanks!

## 2019-05-11 LAB — CBC
Hemoglobin: 13 g/dL (ref 11.1–15.9)
MCH: 25.1 pg — ABNORMAL LOW (ref 26.6–33.0)
MCHC: 30.5 g/dL — ABNORMAL LOW (ref 31.5–35.7)
MCV: 82 fL (ref 79–97)
Platelets: 295 10*3/uL (ref 150–450)
RBC: 5.18 x10E6/uL (ref 3.77–5.28)
RDW: 12.4 % (ref 11.7–15.4)
WBC: 10.3 10*3/uL (ref 3.4–10.8)

## 2019-05-11 LAB — ANEMIA PANEL
Ferritin: 139 ng/mL (ref 15–150)
Folate, Hemolysate: 303 ng/mL
Folate, RBC: 711 ng/mL (ref >498)
Hematocrit: 42.6 % (ref 34.0–46.6)
Iron Saturation: 16 % (ref 15–55)
Iron: 57 ug/dL (ref 27–159)
Retic Ct Pct: 2.2 % (ref 0.6–2.6)
Total Iron Binding Capacity: 349 ug/dL (ref 250–450)
UIBC: 292 ug/dL (ref 131–425)
Vitamin B-12: 1100 pg/mL (ref 232–1245)

## 2019-05-11 LAB — TSH: TSH: 3.77 u[IU]/mL (ref 0.450–4.500)

## 2019-05-15 LAB — CYTOLOGY - PAP
Chlamydia: POSITIVE — AB
Comment: NEGATIVE
Comment: NEGATIVE
Comment: NORMAL
Diagnosis: NEGATIVE
Neisseria Gonorrhea: NEGATIVE
Trichomonas: POSITIVE — AB

## 2019-05-16 ENCOUNTER — Telehealth: Payer: Self-pay | Admitting: Family Medicine

## 2019-05-16 ENCOUNTER — Other Ambulatory Visit: Payer: Self-pay

## 2019-05-16 ENCOUNTER — Ambulatory Visit (INDEPENDENT_AMBULATORY_CARE_PROVIDER_SITE_OTHER): Payer: Medicaid Other

## 2019-05-16 DIAGNOSIS — A749 Chlamydial infection, unspecified: Secondary | ICD-10-CM | POA: Diagnosis present

## 2019-05-16 MED ORDER — AZITHROMYCIN 500 MG PO TABS
1000.0000 mg | ORAL_TABLET | Freq: Once | ORAL | Status: AC
  Administered 2019-05-16: 1000 mg via ORAL

## 2019-05-16 NOTE — Telephone Encounter (Signed)
Thank you Page!  Azithromycin 1000 mg ordered to be administered at nurse visit for treatment of chlamydia.  Please counsel patient to inform all sexual partners and ensure that they are treated before having unprotected sex with them.  Thank you.

## 2019-05-16 NOTE — Telephone Encounter (Signed)
Left a voice message asking that patient call us back regarding her test results and that she will need to make a nurse appointment to get treatment for these results.  Also sent her a message over MyChart with more details regarding her specific test result.  When she calls back, we will need to speak with her regarding informing and treating partners.

## 2019-05-16 NOTE — Telephone Encounter (Signed)
Patient tested positive for trichomonas at her visit on 05/09/2019 and was informed of this result at that time.  She was given metronidazole 500 mg twice daily for 14 days and counseled on informing all of her partners and abstaining from unprotected sex until they are all treated.

## 2019-05-16 NOTE — Progress Notes (Signed)
Patient presents in nurse clinic for treatment of Chlamydia. Patient given 1 gram Azithromycin PO. Patient informed to refrain from sexual intercourse for at least 7 days, or 7 days after date of partners treatment.

## 2019-05-16 NOTE — Addendum Note (Signed)
Addended by: Maia Breslow C on: 05/16/2019 02:17 PM   Modules accepted: Orders

## 2019-05-16 NOTE — Telephone Encounter (Signed)
Patient calls back- I informed her of STD results and I scheduled her a nurse visit for this afternoon. Just document in this encounter what she needs.

## 2019-05-18 ENCOUNTER — Other Ambulatory Visit: Payer: Medicaid Other

## 2019-07-17 ENCOUNTER — Other Ambulatory Visit (HOSPITAL_COMMUNITY)
Admission: RE | Admit: 2019-07-17 | Discharge: 2019-07-17 | Disposition: A | Discharge status: Home / Self Care | Payer: Medicaid Other | Source: Ambulatory Visit | Attending: Family Medicine | Admitting: Family Medicine

## 2019-07-17 ENCOUNTER — Ambulatory Visit (INDEPENDENT_AMBULATORY_CARE_PROVIDER_SITE_OTHER): Payer: Medicaid Other | Admitting: Family Medicine

## 2019-07-17 ENCOUNTER — Other Ambulatory Visit: Payer: Self-pay

## 2019-07-17 VITALS — BP 110/78 | HR 81 | Wt 215.8 lb

## 2019-07-17 DIAGNOSIS — Z113 Encounter for screening for infections with a predominantly sexual mode of transmission: Secondary | ICD-10-CM | POA: Diagnosis present

## 2019-07-17 LAB — POCT WET PREP (WET MOUNT)
Clue Cells Wet Prep Whiff POC: NEGATIVE
Trichomonas Wet Prep HPF POC: ABSENT

## 2019-07-17 NOTE — Assessment & Plan Note (Signed)
Wet prep without trichomonas, few yeast present.  Discussed with patient, she does not currently have any vaginal discharge complaints, therefore would not treat.  We will follow up on gonorrhea/chlamydia swab.  We will also follow-up on HIV/RPR.

## 2019-07-17 NOTE — Progress Notes (Signed)
     Subjective: Chief Complaint  Patient presents with  . Vaginal Discharge     HPI: Shelley Payne is a 23 y.o. presenting to clinic today to discuss the following:  1 STD Testing Patient reports having intercourse with a new partner, denying protection.  States that she does not currently have any symptoms, denies any change in vaginal discharge.  States that she would like to be tested for all STDs including gonorrhea, chlamydia, HIV, syphilis.  States that she is compliant and up-to-date with her Depo Shot.     ROS noted in HPI. Chief complaint noted.  Other Pertinent PMH: Hypertension, type 2 diabetes, hypothyroidism Past Medical, Surgical, Social, and Family History Reviewed & Updated per EMR.      Social History   Tobacco Use  Smoking Status Never Smoker  Smokeless Tobacco Never Used   Smoking status noted.    Objective: BP 110/78   Pulse 81   Wt 215 lb 12.8 oz (97.9 kg)   SpO2 99%   BMI 39.47 kg/m  Vitals and nursing notes reviewed  Physical Exam:  General: 23 y.o. female in NAD Lungs: Breathing comfortably on room air Skin: warm and dry GU: Pelvic exam performed with patient supine.  Chaperone in room.  Bilateral labia without abnormalities, no inguinal LAD palpated.  Cervix exhibits white-yellow discharge, no cervix abnormalities.  No vaginal lesions.  Vaginal discharge white-yellow.  Bimanual exam without cervical motion tenderness.    Results for orders placed or performed in visit on 07/17/19 (from the past 72 hour(s))  POCT Wet Prep [facility] [place])     Status: Abnormal   Collection Time: 07/17/19  3:55 PM  Result Value Ref Range   Source Wet Prep POC VAG    WBC, Wet Prep HPF POC 5-10    Bacteria Wet Prep HPF POC Moderate (A) Few   Clue Cells Wet Prep HPF POC None None   Clue Cells Wet Prep Whiff POC Negative Whiff    Yeast Wet Prep HPF POC Few (A) None   KOH Wet Prep POC Few (A) None   Trichomonas Wet Prep HPF POC Absent Absent     Assessment/Plan:  Encounter for screening examination for sexually transmitted disease Wet prep without trichomonas, few yeast present.  Discussed with patient, she does not currently have any vaginal discharge complaints, therefore would not treat.  We will follow up on gonorrhea/chlamydia swab.  We will also follow-up on HIV/RPR.     PATIENT EDUCATION PROVIDED: See AVS    Diagnosis and plan along with any newly prescribed medication(s) were discussed in detail with this patient today. The patient verbalized understanding and agreed with the plan. Patient advised if symptoms worsen return to clinic or ER.     Orders Placed This Encounter  Procedures  . HIV antibody (with reflex)  . RPR  . POCT Wet Prep [facility])    No orders of the defined types were placed in this encounter.    [place] Constable, DO 07/17/2019, 5:02 PM PGY-2 [place]

## 2019-07-17 NOTE — Patient Instructions (Signed)
Thank you for coming to see me today. It was a pleasure. Today we talked about:   We will contact you with your results.  If you have any questions or concerns, please do not hesitate to call the office at [phone].  Best,   Arizona Constable, DO

## 2019-07-18 LAB — CERVICOVAGINAL ANCILLARY ONLY
Chlamydia: POSITIVE — AB
Comment: NEGATIVE
Comment: NORMAL
Neisseria Gonorrhea: NEGATIVE

## 2019-07-18 LAB — HIV ANTIBODY (ROUTINE TESTING W REFLEX): HIV Screen 4th Generation wRfx: NONREACTIVE

## 2019-07-18 LAB — RPR: RPR Ser Ql: NONREACTIVE

## 2019-07-19 ENCOUNTER — Encounter: Payer: Self-pay | Admitting: Family Medicine

## 2019-07-19 DIAGNOSIS — A749 Chlamydial infection, unspecified: Secondary | ICD-10-CM

## 2019-07-19 MED ORDER — AZITHROMYCIN 500 MG PO TABS: 1000.0000 mg | ORAL_TABLET | Freq: Once | ORAL | 0 refills | Status: AC

## 2019-07-23 ENCOUNTER — Encounter: Payer: Self-pay | Admitting: Family Medicine

## 2019-07-23 DIAGNOSIS — A749 Chlamydial infection, unspecified: Secondary | ICD-10-CM

## 2019-07-23 MED ORDER — AZITHROMYCIN 500 MG PO TABS
1000.0000 mg | ORAL_TABLET | Freq: Once | ORAL | Status: AC
  Administered 2019-07-24: 1000 mg via ORAL

## 2019-07-23 NOTE — Progress Notes (Signed)
Called patient as her chlamydia testing returned positive again, despite having treatment after her October positive diagnosis. She is concerned the the infection never went away as she reports that she has not had any unprotected sex since. She does report new partner in the last couple of weeks but used barrier protection.  Unclear if patient has recurrent infection or persistent. Will treat with 1 g azithro. And then test of cure in 3-4 weeks after antibiotic dosing. Patient scheduled for nurse appointment 12/29 at 9:30 AM.  Clinic azithromycin ordered.

## 2019-07-24 ENCOUNTER — Other Ambulatory Visit: Payer: Self-pay

## 2019-07-24 ENCOUNTER — Ambulatory Visit (INDEPENDENT_AMBULATORY_CARE_PROVIDER_SITE_OTHER): Payer: Medicaid Other

## 2019-07-24 DIAGNOSIS — A749 Chlamydial infection, unspecified: Secondary | ICD-10-CM

## 2019-07-24 MED ORDER — AZITHROMYCIN 500 MG PO TABS: 1000.0000 mg | ORAL_TABLET | Freq: Once | ORAL | Status: DC

## 2019-07-24 NOTE — Progress Notes (Signed)
Patient in nurse clinic today for STD treatment of chlamydia.    Patient advised to abstain from sex for 7-10 days after treatment of self and partner.    Azithromycin 1 GM PO x 1 given per Dr. Bella Kennedy orders.   Patient to follow up in 2-3 months for re-screening.    Provided condoms and advised to use with all sexual activity. Patient verbalized understanding.   STD report form fax completed and faxed to [facility] Department at [phone] (STD department).     Talbot Grumbling, RN

## 2019-07-30 ENCOUNTER — Ambulatory Visit: Payer: Medicaid Other

## 2019-08-07 ENCOUNTER — Ambulatory Visit: Payer: Medicaid Other

## 2019-08-07 ENCOUNTER — Ambulatory Visit: Payer: Medicaid Other | Admitting: Family Medicine

## 2019-08-17 ENCOUNTER — Emergency Department (HOSPITAL_COMMUNITY)
Admission: EM | Admit: 2019-08-17 | Discharge: 2019-08-17 | Disposition: A | Discharge status: Home / Self Care | Payer: Medicaid Other | Attending: Emergency Medicine | Admitting: Emergency Medicine

## 2019-08-17 ENCOUNTER — Encounter (HOSPITAL_COMMUNITY): Payer: Self-pay | Admitting: Emergency Medicine

## 2019-08-17 ENCOUNTER — Other Ambulatory Visit: Payer: Self-pay

## 2019-08-17 DIAGNOSIS — R1031 Right lower quadrant pain: Secondary | ICD-10-CM | POA: Insufficient documentation

## 2019-08-17 DIAGNOSIS — R110 Nausea: Secondary | ICD-10-CM | POA: Insufficient documentation

## 2019-08-17 DIAGNOSIS — R300 Dysuria: Secondary | ICD-10-CM | POA: Insufficient documentation

## 2019-08-17 DIAGNOSIS — Z5321 Procedure and treatment not carried out due to patient leaving prior to being seen by health care provider: Secondary | ICD-10-CM | POA: Insufficient documentation

## 2019-08-17 LAB — CBC
HCT: 43.5 % (ref 36.0–46.0)
Hemoglobin: 13.7 g/dL (ref 12.0–15.0)
MCH: 25.6 pg — ABNORMAL LOW (ref 26.0–34.0)
MCHC: 31.5 g/dL (ref 30.0–36.0)
MCV: 81.2 fL (ref 80.0–100.0)
Platelets: 294 10*3/uL (ref 150–400)
RBC: 5.36 MIL/uL — ABNORMAL HIGH (ref 3.87–5.11)
RDW: 13.7 % (ref 11.5–15.5)
WBC: 10.9 10*3/uL — ABNORMAL HIGH (ref 4.0–10.5)
nRBC: 0 % (ref 0.0–0.2)

## 2019-08-17 LAB — I-STAT BETA HCG BLOOD, ED (MC, WL, AP ONLY): I-stat hCG, quantitative: <5 m[IU]/mL (ref <5)

## 2019-08-17 LAB — COMPREHENSIVE METABOLIC PANEL
ALT: 25 U/L (ref 0–44)
AST: 19 U/L (ref 15–41)
Albumin: 3.7 g/dL (ref 3.5–5.0)
Alkaline Phosphatase: 73 U/L (ref 38–126)
Anion gap: 10 (ref 5–15)
BUN: 8 mg/dL (ref 6–20)
CO2: 23 mmol/L (ref 22–32)
Calcium: 9.2 mg/dL (ref 8.9–10.3)
Chloride: 104 mmol/L (ref 98–111)
Creatinine, Ser: 0.55 mg/dL (ref 0.44–1.00)
GFR calc Af Amer: >60 mL/min (ref >60)
GFR calc non Af Amer: >60 mL/min (ref >60)
Glucose, Bld: 295 mg/dL — ABNORMAL HIGH (ref 70–99)
Potassium: 4.2 mmol/L (ref 3.5–5.1)
Sodium: 137 mmol/L (ref 135–145)
Total Bilirubin: 0.9 mg/dL (ref 0.3–1.2)
Total Protein: 7.1 g/dL (ref 6.5–8.1)

## 2019-08-17 LAB — LIPASE, BLOOD: Lipase: 36 U/L (ref 11–51)

## 2019-08-17 MED ORDER — SODIUM CHLORIDE 0.9% FLUSH: 3.0000 mL | Freq: Once | INTRAVENOUS | Status: DC

## 2019-08-17 NOTE — ED Triage Notes (Signed)
Pt reports RLQ sharp pain also accompanied with dysuria, oliguria. Denies,fevers, discharge or the possibility of pregnancy. She is nauseated at times but has not vomited.

## 2019-08-20 ENCOUNTER — Other Ambulatory Visit (HOSPITAL_COMMUNITY)
Admission: RE | Admit: 2019-08-20 | Discharge: 2019-08-20 | Disposition: A | Discharge status: Home / Self Care | Payer: Medicaid Other | Source: Ambulatory Visit | Attending: Family Medicine | Admitting: Family Medicine

## 2019-08-20 ENCOUNTER — Ambulatory Visit (INDEPENDENT_AMBULATORY_CARE_PROVIDER_SITE_OTHER): Payer: Medicaid Other | Admitting: Family Medicine

## 2019-08-20 ENCOUNTER — Other Ambulatory Visit: Payer: Self-pay

## 2019-08-20 VITALS — BP 98/56 | HR 104 | Wt 210.6 lb

## 2019-08-20 DIAGNOSIS — E1165 Type 2 diabetes mellitus with hyperglycemia: Secondary | ICD-10-CM | POA: Diagnosis not present

## 2019-08-20 DIAGNOSIS — Z3042 Encounter for surveillance of injectable contraceptive: Secondary | ICD-10-CM | POA: Diagnosis not present

## 2019-08-20 DIAGNOSIS — E063 Autoimmune thyroiditis: Secondary | ICD-10-CM | POA: Diagnosis not present

## 2019-08-20 DIAGNOSIS — R102 Pelvic and perineal pain: Secondary | ICD-10-CM | POA: Insufficient documentation

## 2019-08-20 LAB — POCT URINALYSIS DIP (MANUAL ENTRY)
Bilirubin, UA: NEGATIVE
Glucose, UA: >=1000 mg/dL — AB
Nitrite, UA: POSITIVE — AB
Protein Ur, POC: 100 mg/dL — AB
Spec Grav, UA: 1.015 (ref 1.010–1.025)
Urobilinogen, UA: 0.2 E.U./dL
pH, UA: 6 (ref 5.0–8.0)

## 2019-08-20 LAB — POCT GLYCOSYLATED HEMOGLOBIN (HGB A1C): HbA1c, POC (controlled diabetic range): 12.1 % — AB (ref 0.0–7.0)

## 2019-08-20 LAB — POCT UA - MICROSCOPIC ONLY

## 2019-08-20 LAB — POCT WET PREP (WET MOUNT)
Clue Cells Wet Prep Whiff POC: NEGATIVE
Trichomonas Wet Prep HPF POC: ABSENT

## 2019-08-20 LAB — POCT URINE PREGNANCY: Preg Test, Ur: NEGATIVE

## 2019-08-20 MED ORDER — CEPHALEXIN 500 MG PO CAPS: 500.0000 mg | ORAL_CAPSULE | Freq: Four times a day (QID) | ORAL | 0 refills | Status: DC

## 2019-08-20 MED ORDER — MEDROXYPROGESTERONE ACETATE 150 MG/ML IM SUSP: 150.0000 mg | Freq: Once | INTRAMUSCULAR | Status: DC

## 2019-08-20 MED ORDER — MEDROXYPROGESTERONE ACETATE 150 MG/ML IM SUSP
150.0000 mg | Freq: Once | INTRAMUSCULAR | Status: AC
  Administered 2019-08-20: 150 mg via INTRAMUSCULAR

## 2019-08-20 NOTE — Assessment & Plan Note (Signed)
Currently not taking any medication for this.  We will check TSH today.

## 2019-08-20 NOTE — Assessment & Plan Note (Addendum)
UA is consistent with a UTI and notably has greater than 1000 glucose, which is likely contributing to patient's risk of getting a UTI.  Wet prep is negative.  We will follow up on gonorrhea and chlamydia although less likely to be positive if patient's last sexual encounter was 3 months ago.  Will treat with Keflex 500 mg 4 times daily for 7 days given patient's level of pelvic pain.  We will also obtain CBC, HIV, RPR.  Patient was encouraged to call us if her symptoms do not improve within a week.

## 2019-08-20 NOTE — Patient Instructions (Addendum)
It was nice seeing you today Shelley Payne!  Your urine test was positive for a UTI today.  We will treat with the antibiotic Keflex 4 times per day for the next week.  It is important to take all of this medication as prescribed.  One of the reasons that you likely have a urinary tract infection is because your diabetes is not under control, and bacteria likes to grow or there is a lot of sugar.  Please return soon to see your PCP and discuss how best to treat your diabetes to help prevent further infections like this in the future.  If you continue to have abdominal pain or develop fever in the next few days, please let us know.  If you have any questions or concerns, please feel free to call the clinic.   Be well,  Dr. Frances Furbish
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
## 2019-08-20 NOTE — Assessment & Plan Note (Addendum)
Hemoglobin A1c 12.1 today.  Patient was informed of this and encouraged to speak with her PCP regarding her diabetes.  The importance of treating her diabetes was stressed with her.  She has been resistant to treatment of her diabetes in the past.  Given that she is here for an acute complaint, will have patient follow-up for this important issue.

## 2019-08-21 LAB — CBC
Hematocrit: 43.7 % (ref 34.0–46.6)
Hemoglobin: 13.7 g/dL (ref 11.1–15.9)
MCH: 25.2 pg — ABNORMAL LOW (ref 26.6–33.0)
MCHC: 31.4 g/dL — ABNORMAL LOW (ref 31.5–35.7)
MCV: 80 fL (ref 79–97)
Platelets: 232 10*3/uL (ref 150–450)
RBC: 5.44 x10E6/uL — ABNORMAL HIGH (ref 3.77–5.28)
RDW: 13 % (ref 11.7–15.4)
WBC: 11.9 10*3/uL — ABNORMAL HIGH (ref 3.4–10.8)

## 2019-08-21 LAB — CERVICOVAGINAL ANCILLARY ONLY
Chlamydia: NEGATIVE
Comment: NEGATIVE
Comment: NORMAL
Neisseria Gonorrhea: NEGATIVE

## 2019-08-21 LAB — HIV ANTIBODY (ROUTINE TESTING W REFLEX): HIV Screen 4th Generation wRfx: NONREACTIVE

## 2019-08-21 LAB — TSH: TSH: 4.65 u[IU]/mL — ABNORMAL HIGH (ref 0.450–4.500)

## 2019-08-21 LAB — RPR: RPR Ser Ql: NONREACTIVE

## 2019-08-22 ENCOUNTER — Encounter: Payer: Self-pay | Admitting: Family Medicine

## 2019-08-23 ENCOUNTER — Other Ambulatory Visit: Payer: Self-pay | Admitting: Family Medicine

## 2019-08-23 ENCOUNTER — Telehealth: Payer: Self-pay

## 2019-08-23 ENCOUNTER — Ambulatory Visit (HOSPITAL_COMMUNITY): Admission: RE | Admit: 2019-08-23 | Payer: Medicaid Other | Source: Ambulatory Visit

## 2019-08-23 DIAGNOSIS — R1031 Right lower quadrant pain: Secondary | ICD-10-CM

## 2019-08-23 DIAGNOSIS — E063 Autoimmune thyroiditis: Secondary | ICD-10-CM

## 2019-08-23 NOTE — Telephone Encounter (Signed)
Called scheduling for stat CT. I was told to have pt head over to [facility] to start drinking the prep. Called pt. Pt stated "I don't have a ride right now. I will try to get there as soon as I can."  I told pt to try her best to get there as Dr. Frances Furbish is concerned she is still in pain. Pt thinks it is just a stomach virus. I told her she has had the sx for several days and that is why Dr. Frances Furbish is concerned and if it gets worse she could be in danger. Pt understands and will do her best to get to [facility]. Sunday Spillers, CMA

## 2019-08-23 NOTE — Addendum Note (Signed)
Addended by: Anselmo Rod on: 08/23/2019 12:16 PM   Modules accepted: Orders

## 2019-08-23 NOTE — Telephone Encounter (Signed)
-----   Message from Lennox Solders, MD sent at 08/23/2019  9:15 AM EST ----- Regarding: Stat CT scan for patient Would you call and schedule this patient's CT scan for either today or tomorrow if possible?  She is continuing to have abdominal pain and I am worried about appendicitis.  Thanks.

## 2019-08-23 NOTE — Telephone Encounter (Signed)
Thank you!! Shari 

## 2019-08-24 LAB — T4, FREE: Free T4: 1.08 ng/dL (ref 0.82–1.77)

## 2019-08-26 ENCOUNTER — Other Ambulatory Visit: Payer: Self-pay

## 2019-08-26 ENCOUNTER — Emergency Department (HOSPITAL_COMMUNITY): Payer: Medicaid Other

## 2019-08-26 ENCOUNTER — Encounter (HOSPITAL_COMMUNITY): Payer: Self-pay

## 2019-08-26 ENCOUNTER — Inpatient Hospital Stay (HOSPITAL_COMMUNITY)
Admission: EM | Admit: 2019-08-26 | Discharge: 2019-08-28 | DRG: 872 | Disposition: A | Discharge status: Home / Self Care | Payer: Medicaid Other | Attending: Internal Medicine | Admitting: Internal Medicine

## 2019-08-26 DIAGNOSIS — I10 Essential (primary) hypertension: Secondary | ICD-10-CM | POA: Diagnosis present

## 2019-08-26 DIAGNOSIS — E871 Hypo-osmolality and hyponatremia: Secondary | ICD-10-CM

## 2019-08-26 DIAGNOSIS — J45909 Unspecified asthma, uncomplicated: Secondary | ICD-10-CM | POA: Diagnosis present

## 2019-08-26 DIAGNOSIS — A4151 Sepsis due to Escherichia coli [E. coli]: Principal | ICD-10-CM | POA: Diagnosis present

## 2019-08-26 DIAGNOSIS — R109 Unspecified abdominal pain: Secondary | ICD-10-CM

## 2019-08-26 DIAGNOSIS — Z6838 Body mass index (BMI) 38.0-38.9, adult: Secondary | ICD-10-CM

## 2019-08-26 DIAGNOSIS — Z9119 Patient's noncompliance with other medical treatment and regimen: Secondary | ICD-10-CM

## 2019-08-26 DIAGNOSIS — N10 Acute tubulo-interstitial nephritis: Secondary | ICD-10-CM | POA: Diagnosis present

## 2019-08-26 DIAGNOSIS — Z20822 Contact with and (suspected) exposure to covid-19: Secondary | ICD-10-CM | POA: Diagnosis present

## 2019-08-26 DIAGNOSIS — E876 Hypokalemia: Secondary | ICD-10-CM | POA: Diagnosis present

## 2019-08-26 DIAGNOSIS — E063 Autoimmune thyroiditis: Secondary | ICD-10-CM | POA: Diagnosis present

## 2019-08-26 DIAGNOSIS — N12 Tubulo-interstitial nephritis, not specified as acute or chronic: Secondary | ICD-10-CM

## 2019-08-26 DIAGNOSIS — E1165 Type 2 diabetes mellitus with hyperglycemia: Secondary | ICD-10-CM | POA: Diagnosis present

## 2019-08-26 HISTORY — DX: Hypo-osmolality and hyponatremia: E87.1

## 2019-08-26 LAB — BLOOD CULTURE ID PANEL (REFLEXED)

## 2019-08-26 LAB — URINALYSIS, ROUTINE W REFLEX MICROSCOPIC
Bilirubin Urine: NEGATIVE
Glucose, UA: >=500 mg/dL — AB
Ketones, ur: NEGATIVE mg/dL
Nitrite: NEGATIVE
Protein, ur: NEGATIVE mg/dL
Specific Gravity, Urine: 1.015 (ref 1.005–1.030)
WBC, UA: >50 WBC/hpf — ABNORMAL HIGH (ref 0–5)
pH: 6 (ref 5.0–8.0)

## 2019-08-26 LAB — COMPREHENSIVE METABOLIC PANEL
ALT: 26 U/L (ref 0–44)
AST: 18 U/L (ref 15–41)
Albumin: 2.9 g/dL — ABNORMAL LOW (ref 3.5–5.0)
Alkaline Phosphatase: 114 U/L (ref 38–126)
Anion gap: 14 (ref 5–15)
BUN: 12 mg/dL (ref 6–20)
CO2: 23 mmol/L (ref 22–32)
Calcium: 9 mg/dL (ref 8.9–10.3)
Chloride: 92 mmol/L — ABNORMAL LOW (ref 98–111)
Creatinine, Ser: 0.76 mg/dL (ref 0.44–1.00)
GFR calc Af Amer: >60 mL/min (ref >60)
GFR calc non Af Amer: >60 mL/min (ref >60)
Glucose, Bld: 455 mg/dL — ABNORMAL HIGH (ref 70–99)
Potassium: 3.9 mmol/L (ref 3.5–5.1)
Sodium: 129 mmol/L — ABNORMAL LOW (ref 135–145)
Total Bilirubin: 0.6 mg/dL (ref 0.3–1.2)
Total Protein: 8.1 g/dL (ref 6.5–8.1)

## 2019-08-26 LAB — CBC
HCT: 35.2 % — ABNORMAL LOW (ref 36.0–46.0)
Hemoglobin: 11.5 g/dL — ABNORMAL LOW (ref 12.0–15.0)
MCH: 25.4 pg — ABNORMAL LOW (ref 26.0–34.0)
MCHC: 32.7 g/dL (ref 30.0–36.0)
MCV: 77.9 fL — ABNORMAL LOW (ref 80.0–100.0)
Platelets: 351 10*3/uL (ref 150–400)
RBC: 4.52 MIL/uL (ref 3.87–5.11)
RDW: 14.2 % (ref 11.5–15.5)
WBC: 22.1 10*3/uL — ABNORMAL HIGH (ref 4.0–10.5)
nRBC: 0 % (ref 0.0–0.2)

## 2019-08-26 LAB — GLUCOSE, CAPILLARY
Glucose-Capillary: 279 mg/dL — ABNORMAL HIGH (ref 70–99)
Glucose-Capillary: 188 mg/dL — ABNORMAL HIGH (ref 70–99)
Glucose-Capillary: 203 mg/dL — ABNORMAL HIGH (ref 70–99)

## 2019-08-26 LAB — PROTIME-INR
INR: 1.1 (ref 0.8–1.2)
Prothrombin Time: 14.2 seconds (ref 11.4–15.2)

## 2019-08-26 LAB — LIPASE, BLOOD: Lipase: 24 U/L (ref 11–51)

## 2019-08-26 LAB — HEMOGLOBIN A1C
Hgb A1c MFr Bld: 12.1 % — ABNORMAL HIGH (ref 4.8–5.6)
Mean Plasma Glucose: 300.57 mg/dL

## 2019-08-26 LAB — APTT: aPTT: 30 seconds (ref 24–36)

## 2019-08-26 LAB — LACTIC ACID, PLASMA
Lactic Acid, Venous: 1.5 mmol/L (ref 0.5–1.9)
Lactic Acid, Venous: 1.5 mmol/L (ref 0.5–1.9)

## 2019-08-26 LAB — SARS CORONAVIRUS 2 (TAT 6-24 HRS): SARS Coronavirus 2: NEGATIVE

## 2019-08-26 LAB — I-STAT BETA HCG BLOOD, ED (MC, WL, AP ONLY): I-stat hCG, quantitative: 14.3 m[IU]/mL — ABNORMAL HIGH (ref <5)

## 2019-08-26 MED ORDER — INSULIN GLARGINE 100 UNIT/ML ~~LOC~~ SOLN
30.0000 [IU] | Freq: Every day | SUBCUTANEOUS | Status: DC
  Administered 2019-08-26 – 2019-08-27 (×2): 30 [IU] via SUBCUTANEOUS
  Filled 2019-08-26 (×2): qty 0.3

## 2019-08-26 MED ORDER — SODIUM CHLORIDE 0.9 % IV SOLN
1.0000 g | Freq: Every day | INTRAVENOUS | Status: DC
  Administered 2019-08-26: 06:00:00 1 g via INTRAVENOUS
  Filled 2019-08-26: qty 10

## 2019-08-26 MED ORDER — ENOXAPARIN SODIUM 40 MG/0.4ML ~~LOC~~ SOLN
40.0000 mg | SUBCUTANEOUS | Status: DC
  Administered 2019-08-26 – 2019-08-28 (×3): 40 mg via SUBCUTANEOUS
  Filled 2019-08-26 (×3): qty 0.4

## 2019-08-26 MED ORDER — SODIUM CHLORIDE 0.9 % IV BOLUS
1000.0000 mL | Freq: Once | INTRAVENOUS | Status: AC
  Administered 2019-08-26: 03:00:00 1000 mL via INTRAVENOUS

## 2019-08-26 MED ORDER — IOHEXOL 300 MG/ML  SOLN
100.0000 mL | Freq: Once | INTRAMUSCULAR | Status: AC | PRN
  Administered 2019-08-26: 04:00:00 100 mL via INTRAVENOUS

## 2019-08-26 MED ORDER — INSULIN ASPART 100 UNIT/ML ~~LOC~~ SOLN
0.0000 [IU] | Freq: Three times a day (TID) | SUBCUTANEOUS | Status: DC
  Administered 2019-08-26: 10:00:00 15 [IU] via SUBCUTANEOUS
  Administered 2019-08-26: 3 [IU] via SUBCUTANEOUS
  Administered 2019-08-26 – 2019-08-27 (×2): 8 [IU] via SUBCUTANEOUS
  Administered 2019-08-27: 3 [IU] via SUBCUTANEOUS
  Administered 2019-08-27: 8 [IU] via SUBCUTANEOUS
  Administered 2019-08-28: 08:00:00 3 [IU] via SUBCUTANEOUS
  Administered 2019-08-28: 5 [IU] via SUBCUTANEOUS
  Filled 2019-08-26: qty 0.15

## 2019-08-26 MED ORDER — INSULIN STARTER KIT- SYRINGES (ENGLISH)
1.0000 | Freq: Once | Status: DC
  Filled 2019-08-26: qty 1

## 2019-08-26 MED ORDER — ONDANSETRON HCL 4 MG/2ML IJ SOLN: 4.0000 mg | Freq: Four times a day (QID) | INTRAMUSCULAR | Status: DC | PRN

## 2019-08-26 MED ORDER — ONDANSETRON HCL 4 MG/2ML IJ SOLN
4.0000 mg | Freq: Once | INTRAMUSCULAR | Status: AC
  Administered 2019-08-26: 03:00:00 4 mg via INTRAVENOUS
  Filled 2019-08-26: qty 2

## 2019-08-26 MED ORDER — INSULIN ASPART 100 UNIT/ML ~~LOC~~ SOLN
5.0000 [IU] | Freq: Three times a day (TID) | SUBCUTANEOUS | Status: DC
  Administered 2019-08-26 – 2019-08-27 (×3): 5 [IU] via SUBCUTANEOUS
  Filled 2019-08-26: qty 0.05

## 2019-08-26 MED ORDER — SODIUM CHLORIDE 0.9% FLUSH
3.0000 mL | Freq: Once | INTRAVENOUS | Status: AC
  Administered 2019-08-26: 06:00:00 3 mL via INTRAVENOUS

## 2019-08-26 MED ORDER — MORPHINE SULFATE (PF) 2 MG/ML IV SOLN
2.0000 mg | INTRAVENOUS | Status: DC | PRN
  Administered 2019-08-26 – 2019-08-27 (×5): 2 mg via INTRAVENOUS
  Filled 2019-08-26 (×5): qty 1

## 2019-08-26 MED ORDER — LIVING WELL WITH DIABETES BOOK
Freq: Once | Status: DC
  Filled 2019-08-26: qty 1

## 2019-08-26 MED ORDER — INSULIN ASPART 100 UNIT/ML ~~LOC~~ SOLN
0.0000 [IU] | Freq: Every day | SUBCUTANEOUS | Status: DC
  Administered 2019-08-26 – 2019-08-27 (×2): 2 [IU] via SUBCUTANEOUS
  Filled 2019-08-26: qty 0.05

## 2019-08-26 MED ORDER — SODIUM CHLORIDE 0.9 % IV SOLN: 1.0000 g | Freq: Once | INTRAVENOUS | Status: DC

## 2019-08-26 MED ORDER — SODIUM CHLORIDE (PF) 0.9 % IJ SOLN
INTRAMUSCULAR | Status: AC
  Filled 2019-08-26: qty 50

## 2019-08-26 MED ORDER — ACETAMINOPHEN 325 MG PO TABS: 650.0000 mg | ORAL_TABLET | Freq: Four times a day (QID) | ORAL | Status: DC | PRN

## 2019-08-26 MED ORDER — HYDROMORPHONE HCL 1 MG/ML IJ SOLN
1.0000 mg | Freq: Once | INTRAMUSCULAR | Status: AC
  Administered 2019-08-26: 06:00:00 1 mg via INTRAVENOUS
  Filled 2019-08-26: qty 1

## 2019-08-26 MED ORDER — SODIUM CHLORIDE 0.9 % IV SOLN: INTRAVENOUS | Status: DC

## 2019-08-26 MED ORDER — MORPHINE SULFATE (PF) 4 MG/ML IV SOLN
4.0000 mg | Freq: Once | INTRAVENOUS | Status: AC
  Administered 2019-08-26: 03:00:00 4 mg via INTRAVENOUS
  Filled 2019-08-26: qty 1

## 2019-08-26 MED ORDER — SODIUM CHLORIDE 0.9 % IV SOLN
2.0000 g | Freq: Every day | INTRAVENOUS | Status: DC
  Administered 2019-08-27 – 2019-08-28 (×2): 2 g via INTRAVENOUS
  Filled 2019-08-26 (×2): qty 2
  Filled 2019-08-26: qty 20

## 2019-08-26 NOTE — ED Notes (Signed)
Patient ambulated to the restroom without assistance 

## 2019-08-26 NOTE — ED Notes (Signed)
Pt. Documented in error see above note in chart. 

## 2019-08-26 NOTE — Plan of Care (Signed)
24 yo female with dm1 (poorly controlled) has pyelonephritis and mild hyponatremia,   Na 129, K 3.9, Bun 12, Creatinine 0.76, ASt 18, Alt 26 Wbc 22.1, Hgb 11.5, Plt 351  ED requests admission for pyelonephritis
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
## 2019-08-26 NOTE — ED Notes (Signed)
Patient given water

## 2019-08-26 NOTE — ED Notes (Signed)
Patient given apple juice

## 2019-08-26 NOTE — Plan of Care (Signed)

## 2019-08-26 NOTE — Progress Notes (Signed)
Inpatient Diabetes Program Recommendations  AACE/ADA: New Consensus Statement on Inpatient Glycemic Control  Target Ranges:  Prepandial:   less than 140 mg/dL      Peak postprandial:   less than 180 mg/dL (1-2 hours)      Critically ill patients:  140 - 180 mg/dL  Results for CLEVELAND, PAIZ (MRN [id number]) as of 08/26/2019 09:00  Ref. Range 09/04/2018 18:29  Glucose-Capillary Latest Ref Range: 70 - 99 mg/dL 283 (H)   Results for RUBYLEE, ZAMARRIPA (MRN [id number]) as of 08/26/2019 09:00  Ref. Range 08/26/2019 02:26  Glucose Latest Ref Range: 70 - 99 mg/dL 455 (H)  Results for VESTAL, CRANDALL (MRN [id number]) as of 08/26/2019 09:00  Ref. Range 08/30/2018 09:57 05/09/2019 14:51 08/20/2019 08:45  HbA1c, POC (controlled diabetic range) Latest Ref Range: 0.0 - 7.0 % 10.5 (A) 11.0 (A) 12.1 (A)  Results for LYNORA, DYMOND (MRN [id number]) as of 08/26/2019 09:00  Ref. Range 11/13/2014 14:27 03/05/2016 09:30 08/29/2017 10:15  Hemoglobin A1C Latest Ref Range: 4.8 - 5.6 % 11.6 10.8 11.6 (H)   Review of Glycemic Control  Diabetes history: DM2 Outpatient Diabetes medications: None Current orders for Inpatient glycemic control: Lantus 30 units daily, Novolog 0-15 units TID with meals, Novolog 0-5 units QHS, Novolog 5 units TID with meals  NOTE: Noted consult for Diabetes Coordinator. Diabetes Coordinator is not on campus over the weekend but available by pager from 8am to 5pm for questions or concerns. Chart reviewed. Noted patient has has long history of documented DM and has been resistant to taking DM medications outpatient despite A1C over 10% since 2016. Noted patient seen Dr. Shan Levans on 08/20/19 and noted A1C of 12.1% and patient was encouraged to speak with PCP regarding DM.  Also noted office visit note from Dr. Shan Levans on 05/09/19 which notes patient seems to be overwhelmed and possibly in denial of DM dx. Will order Living Well with DM book, insulin starter kit, RD consult for diet education, and  patient education by bedside RNs. Diabetes Coordinator will follow up with patient on 08/27/19.  Thanks, Barnie Alderman, RN, MSN, CDE Diabetes Coordinator Inpatient Diabetes Program [phone] (Team Pager from 8am to 5pm)

## 2019-08-26 NOTE — H&P (Signed)
History and Physical    Shelley Payne DJS:[id number] DOB: Jan 22, 1996 DOA: 08/26/2019  PCP: Melene Plan, MD   Patient coming from: Home  Chief Complaint: Abdominal  pain  HPI: Shelley Payne is a 24 y.o. female with medical history significant of diabetes mellitus type II currently not taking any medications, morbid obesity who presents from home with complaint of abdominal pain.  Her pain started about 4 days ago and is worsening.  She describes the pain as sharp and it is around the periumbilical area, nonradiating, severe.  Pain is worse when she tries to urinate or have a bowel movement.  She denies any dysuria, back pain.  She has history of uncontrolled diabetes but currently not on any medications.  She works in [employer]  at [employer] .She was seen by her PCP just few days ago and she was prescribed Keflex for the same problem.  She has not picked the medications .She denies any no history  vaginal discharge or bleeding.  She is sexually active with with just 1 person and her last sexual intercourse was few months ago.  She lives with her mother. Patient seen and examined at the bedside in the emergency department.  She is hemodynamically stable.  She was still complaining of periumbilical discomfort.  She denies any fever but said she had chills at home, she also had nausea and vomiting.  Denies any chest pain, shortness of breath, diarrhea headache.  ED Course: CT scan of the abdomen/pelvis showed right-sided pyonephritis.  Pelvic examination was done in the emergency department and there was no concern for PID.  Started on broad-spectrum antibiotics, blood cultures, urine culture sent.  Review of Systems: As per HPI otherwise 10 point review of systems negative.    Past Medical History:  Diagnosis Date  . Acanthosis nigricans, acquired   . Asthma   . Diabetes mellitus, type II (HCC)   . Environmental allergies   . Hypertension   . Hypothyroidism, acquired, autoimmune     . Obesity   . Thyroiditis, autoimmune     Past Surgical History:  Procedure Laterality Date  . none       reports that she has never smoked. She has never used smokeless tobacco. She reports that she does not drink alcohol or use drugs.  No Known Allergies  Family History  Problem Relation Age of Onset  . Obesity Mother   . Hypertension Mother   . Obesity Maternal Aunt   . Asthma Maternal Aunt   . Hypertension Maternal Aunt   . Diabetes Maternal Grandfather   . Hypertension Maternal Grandfather   . Hyperlipidemia Maternal Grandmother      Prior to Admission medications   Medication Sig Start Date End Date Taking? Authorizing Provider  glucose blood (ACCU-CHEK AVIVA) test strip Use as instructed 08/29/17  Yes Dalbert Garnet, Caren D, MD  Lancets (ACCU-CHEK MULTICLIX) lancets Use as instructed 08/29/17  Yes Dalbert Garnet, Caren D, MD  cephALEXin (KEFLEX) 500 MG capsule Take 1 capsule (500 mg total) by mouth 4 (four) times daily. 08/20/19   Lennox Solders, MD    Physical Exam: Vitals:   08/26/19 0630 08/26/19 0700 08/26/19 0730 08/26/19 0800  BP: 139/87 (!) 133/92 (!) 141/93 117/81  Pulse: (!) 101 (!) 106 (!) 117 (!) 114  Resp: 12 (!) 25 (!) 27 (!) 25  Temp:      TempSrc:      SpO2: 97% 97% 98% 98%  Weight:      Height:  Constitutional: Morbidly obese, generalized weakness Vitals:   08/26/19 0630 08/26/19 0700 08/26/19 0730 08/26/19 0800  BP: 139/87 (!) 133/92 (!) 141/93 117/81  Pulse: (!) 101 (!) 106 (!) 117 (!) 114  Resp: 12 (!) 25 (!) 27 (!) 25  Temp:      TempSrc:      SpO2: 97% 97% 98% 98%  Weight:      Height:       Eyes: PERRL, lids and conjunctivae normal ENMT: Mucous membranes are moist.  Neck: normal, supple, no masses, no thyromegaly Respiratory: clear to auscultation bilaterally, no wheezing, no crackles. Normal respiratory effort. No accessory muscle use.  Cardiovascular: Regular rate and rhythm, no murmurs / rubs / gallops. No extremity edema.   Abdomen: Tenderness around periumbilical area, no masses palpated. No hepatosplenomegaly. Bowel sounds positive.  No costovertebral angle tenderness Musculoskeletal: no clubbing / cyanosis. No joint deformity upper and lower extremities.  Skin: no rashes, lesions, ulcers. No induration Neurologic: CN 2-12 grossly intact.  Strength 5/5 in all 4.  Psychiatric: Normal judgment and insight. Alert and oriented x 3. Normal mood.   Foley Catheter:None  Labs on Admission: I have personally reviewed following labs and imaging studies  CBC: Recent Labs  Lab 08/20/19 1055 08/26/19 0226  WBC 11.9* 22.1*  HGB 13.7 11.5*  HCT 43.7 35.2*  MCV 80 77.9*  PLT 232 970   Basic Metabolic Panel: Recent Labs  Lab 08/26/19 0226  NA 129*  K 3.9  CL 92*  CO2 23  GLUCOSE 455*  BUN 12  CREATININE 0.76  CALCIUM 9.0   GFR: Estimated Creatinine Clearance: 115.7 mL/min (by C-G formula based on SCr of 0.76 mg/dL). Liver Function Tests: Recent Labs  Lab 08/26/19 0226  AST 18  ALT 26  ALKPHOS 114  BILITOT 0.6  PROT 8.1  ALBUMIN 2.9*   Recent Labs  Lab 08/26/19 0226  LIPASE 24   No results for input(s): AMMONIA in the last 168 hours. Coagulation Profile: Recent Labs  Lab 08/26/19 0551  INR 1.1   Cardiac Enzymes: No results for input(s): CKTOTAL, CKMB, CKMBINDEX, TROPONINI in the last 168 hours. BNP (last 3 results) No results for input(s): PROBNP in the last 8760 hours. HbA1C: No results for input(s): HGBA1C in the last 72 hours. CBG: No results for input(s): GLUCAP in the last 168 hours. Lipid Profile: No results for input(s): CHOL, HDL, LDLCALC, TRIG, CHOLHDL, LDLDIRECT in the last 72 hours. Thyroid Function Tests: No results for input(s): TSH, T4TOTAL, FREET4, T3FREE, THYROIDAB in the last 72 hours. Anemia Panel: No results for input(s): VITAMINB12, FOLATE, FERRITIN, TIBC, IRON, RETICCTPCT in the last 72 hours. Urine analysis:    Component Value Date/Time   COLORURINE  YELLOW 08/26/2019 0401   APPEARANCEUR HAZY (A) 08/26/2019 0401   LABSPEC 1.015 08/26/2019 0401   PHURINE 6.0 08/26/2019 0401   GLUCOSEU >=500 (A) 08/26/2019 0401   HGBUR LARGE (A) 08/26/2019 0401   BILIRUBINUR NEGATIVE 08/26/2019 0401   BILIRUBINUR negative 08/20/2019 0950   KETONESUR NEGATIVE 08/26/2019 0401   PROTEINUR NEGATIVE 08/26/2019 0401   UROBILINOGEN 0.2 08/20/2019 0950   NITRITE NEGATIVE 08/26/2019 0401   LEUKOCYTESUR SMALL (A) 08/26/2019 0401    Radiological Exams on Admission: CT ABDOMEN PELVIS W CONTRAST  Result Date: 08/26/2019 CLINICAL DATA:  Right lower quadrant and periumbilical abdominal pain. EXAM: CT ABDOMEN AND PELVIS WITH CONTRAST TECHNIQUE: Multidetector CT imaging of the abdomen and pelvis was performed using the standard protocol following bolus administration of intravenous contrast. CONTRAST:  OMNIPAQUE IOHEXOL 300 MG/ML  SOLN COMPARISON:  CT abdomen pelvis 05/12/2018 FINDINGS: Lower chest: Normal heart size. Dependent atelectasis right lower lobe. No pleural effusion. Hepatobiliary: The liver is normal in size and contour. Gallbladder is unremarkable. Pancreas: Unremarkable Spleen: Unremarkable Adrenals/Urinary Tract: Normal adrenal glands. The right kidney is enlarged. There is patchy hypoenhancement involving the right kidney with perinephric fat stranding. There is urothelial enhancement involving the majority of the right ureter. Left kidney is unremarkable. Urinary bladder is unremarkable. Stomach/Bowel: No abnormal bowel wall thickening or evidence for bowel obstruction. No free fluid or free intraperitoneal air. Normal morphology of the stomach. Normal appendix. Vascular/Lymphatic: Normal caliber abdominal aorta. No retroperitoneal lymphadenopathy. Reproductive: Uterus and adnexal structures are unremarkable. Other: None. Musculoskeletal: No aggressive or acute appearing osseous lesions. IMPRESSION: The right kidney is enlarged with fairly extensive patchy  areas of hypoenhancement. There is a small amount of perinephric fat stranding. There is hyperenhancement of the urothelium involving the right ureter. Overall findings are concerning for ascending urinary tract infection and associated pyelonephritis of the right kidney. Electronically Signed   By: Annia Belt M.D.   On: 08/26/2019 04:38    Assessment/Plan Principal Problem:   Pyelonephritis Active Problems:   Obesity, morbid (HCC)   Type 2 diabetes mellitus with hyperglycemia, without long-term current use of insulin (HCC)   Hyponatremia  Acute right pyelonephritis: Presented with abdomen pain. CT abd/pelvis showed  enlarged  right kidney is enlarged with fairly extensive patchy areas of hypoenhancement suggesting ascending urinary tract infection and associated pyelonephritis of the right kidney.  Started on ceftriaxone.  UA was grossly suggestive of UTI.  Follow-up urine culture, blood culture Pelvic examination was done in the emergency department.  No concern for pelvic inflammatory disease.  She was recently prescribed Keflex by her PCP.  She did not pick up the prescription.  Sepsis: Presented with severe leukocytosis, chills.  Follow blood cultures.  Continue antibiotics, fluids.  Diabetes type 2: Looks pretty uncontrolled.  Blood sugars in the range of 450.  POCT hemoglobin A1c was 12.1 on 1/25.  She has issues with noncompliance.  She has been advised to take antidiabetic medications but currently she is not taking any. Will request diabetic coordinator consultation.  Will start on Lantus and sliding scale insulin.  Monitor CBGs.  Shewill need insulin on discharge As per the reports she has been resistant to the treatment of her diabetes in the past.  Hypothyroidism: Currently not taking any medications.  TSH was 4.650 on 08/20/2019.  Free T4 was normal.  No indication to start on levothyroxine.  Hyponatremia: Sodium of 129.  Associated with hyperosmolar hyponatremia due to  hyperglycemia.  Monitor BMP  Morbid obesity: BMI of 38.41.She will benefit from follow-up with obesity clinic as an outpatient.   Severity of Illness: The appropriate patient status for this patient is INPATIENT. Inpatient status is judged to be reasonable and necessary in order to provide the required intensity of service to ensure the patient's safety. The patient's presenting symptoms, physical exam findings, and initial radiographic and laboratory data in the context of their chronic comorbidities is felt to place them at high risk for further clinical deterioration. Furthermore, it is not anticipated that the patient will be medically stable for discharge from the hospital within 2 midnights of admission. The following factors support the patient status of inpatient.   " The patient's presenting symptoms include abdominal pain. " The worrisome physical exam findings include abdomen tenderness, UTI. " The initial radiographic and  laboratory data are worrisome because of pyonephritis " The chronic co-morbidities include uncontrolled untreated diabetes mellitus.   * I certify that at the point of admission it is my clinical judgment that the patient will require inpatient hospital care spanning beyond 2 midnights from the point of admission due to high intensity of service, high risk for further deterioration and high frequency of surveillance required.*    DVT prophylaxis: Lovenox Code Status: Full Family Communication: None present at the bedside Consults called: None     Burnadette Pop MD [facility]  08/26/2019, 8:55 AM

## 2019-08-26 NOTE — ED Notes (Signed)
ED Provider at bedside. 

## 2019-08-26 NOTE — ED Triage Notes (Signed)
Pt reports R lower and periumbilical abd pain. Describes is as sharp. Endorses N/V/D, but no fever. States that pain is constant. Still has all of her organs.

## 2019-08-27 DIAGNOSIS — R109 Unspecified abdominal pain: Secondary | ICD-10-CM

## 2019-08-27 DIAGNOSIS — E871 Hypo-osmolality and hyponatremia: Secondary | ICD-10-CM

## 2019-08-27 DIAGNOSIS — N10 Acute tubulo-interstitial nephritis: Secondary | ICD-10-CM

## 2019-08-27 LAB — BASIC METABOLIC PANEL
Anion gap: 13 (ref 5–15)
BUN: 6 mg/dL (ref 6–20)
CO2: 23 mmol/L (ref 22–32)
Calcium: 7.9 mg/dL — ABNORMAL LOW (ref 8.9–10.3)
Chloride: 101 mmol/L (ref 98–111)
Creatinine, Ser: 0.56 mg/dL (ref 0.44–1.00)
GFR calc Af Amer: >60 mL/min (ref >60)
GFR calc non Af Amer: >60 mL/min (ref >60)
Glucose, Bld: 188 mg/dL — ABNORMAL HIGH (ref 70–99)
Potassium: 3.2 mmol/L — ABNORMAL LOW (ref 3.5–5.1)
Sodium: 137 mmol/L (ref 135–145)

## 2019-08-27 LAB — CBC
HCT: 32 % — ABNORMAL LOW (ref 36.0–46.0)
Hemoglobin: 9.9 g/dL — ABNORMAL LOW (ref 12.0–15.0)
MCH: 25.1 pg — ABNORMAL LOW (ref 26.0–34.0)
MCHC: 30.9 g/dL (ref 30.0–36.0)
MCV: 81 fL (ref 80.0–100.0)
Platelets: 361 10*3/uL (ref 150–400)
RBC: 3.95 MIL/uL (ref 3.87–5.11)
RDW: 14.6 % (ref 11.5–15.5)
WBC: 16.1 10*3/uL — ABNORMAL HIGH (ref 4.0–10.5)
nRBC: 0 % (ref 0.0–0.2)

## 2019-08-27 LAB — URINE CULTURE

## 2019-08-27 LAB — HCG, QUANTITATIVE, PREGNANCY: hCG, Beta Chain, Quant, S: <1 m[IU]/mL (ref <5)

## 2019-08-27 LAB — PREGNANCY, URINE: Preg Test, Ur: NEGATIVE

## 2019-08-27 LAB — GLUCOSE, CAPILLARY
Glucose-Capillary: 181 mg/dL — ABNORMAL HIGH (ref 70–99)
Glucose-Capillary: 256 mg/dL — ABNORMAL HIGH (ref 70–99)
Glucose-Capillary: 251 mg/dL — ABNORMAL HIGH (ref 70–99)
Glucose-Capillary: 201 mg/dL — ABNORMAL HIGH (ref 70–99)

## 2019-08-27 LAB — LACTIC ACID, PLASMA: Lactic Acid, Venous: 0.7 mmol/L (ref 0.5–1.9)

## 2019-08-27 MED ORDER — HYDROMORPHONE HCL 1 MG/ML IJ SOLN
1.0000 mg | INTRAMUSCULAR | Status: DC | PRN
  Administered 2019-08-27 (×2): 1 mg via INTRAVENOUS
  Filled 2019-08-27 (×2): qty 1

## 2019-08-27 MED ORDER — OXYCODONE-ACETAMINOPHEN 5-325 MG PO TABS
1.0000 | ORAL_TABLET | ORAL | Status: DC | PRN
  Administered 2019-08-28 (×3): 1 via ORAL
  Filled 2019-08-27 (×3): qty 1

## 2019-08-27 MED ORDER — HYDROMORPHONE HCL 1 MG/ML IJ SOLN
1.0000 mg | Freq: Once | INTRAMUSCULAR | Status: AC
  Administered 2019-08-27: 02:00:00 1 mg via INTRAVENOUS
  Filled 2019-08-27: qty 1

## 2019-08-27 MED ORDER — POTASSIUM CHLORIDE CRYS ER 20 MEQ PO TBCR
40.0000 meq | EXTENDED_RELEASE_TABLET | Freq: Once | ORAL | Status: AC
  Administered 2019-08-27: 06:00:00 40 meq via ORAL
  Filled 2019-08-27: qty 2

## 2019-08-27 NOTE — Progress Notes (Addendum)
[facility]                                                                              Patient Demographics  Shelley Payne, is a 24 y.o. female, DOB - April 07, 1996, ZOX:[id number]  Admit date - 08/26/2019   Admitting Physician [name], MD  Outpatient Primary MD for the patient is [name], MD  Outpatient specialists:   LOS - 1  days   Medical records reviewed and are as summarized below:    chief complaint; abdominal pain      Brief summary   Patient is a 24 year old female with history of diabetes mellitus type 2, currently not taking any medications, obesity presented from home with abdominal pain.  Patient reported that pain started 4 days ago in the periumbilic patient is a al area, radiating to right flank, sharp, and severe.  Pain worse when she tries to urinate or have a BM.  Denied any back pain or dysuria.  She was seen by her PCP a few days ago and was prescribed Keflex for the same problem.  Patient has not picked up the medications.  Denies any history of vaginal discharge or bleeding.  Patient also reported chills at home, no chest pain, shortness of breath. CT abdomen showed right-sided pyelonephritis.  Pelvic exam done in ED showed no concerns for PID.  Assessment & Plan    Principal Problem: Acute right-sided pyelonephritis with UTI, gram-negative rods bacteremia, sepsis -Patient met sepsis criteria at the time of admission with tachycardia, leukocytosis, so secondary to UTI/pyelonephritis -Urine culture shows multiple species, blood culture however showing gram-negative rods -Still in significant pain, 7/10, continue pain control, changed to IV Dilaudid, added Percocet for moderate pain as needed -Continue IV Rocephin 2 g IV daily, follow blood cultures and sensitivities   Active Problems:    Type 2 diabetes mellitus with hyperglycemia, without long-term current use of insulin (HCC) -Uncontrolled, hemoglobin A1c 12.1, CBG 455  at the time of admission -Patient states that she used to be on Metformin, could not remember any other agent.  Unclear why she stopped her oral hypoglycemics -Diabetic coordinator consult placed, nutritionist consult placed for diet education with uncontrolled diabetes mellitus -For now continue Lantus 30 units subcu daily, NovoLog 5 units 3 times daily AC, moderate sliding scale insulin -Concern about noncompliance  I-STAT beta-hCG positive -I-STAT hCG 14.3 -Will obtain urine and serum hCG to confirm any early pregnancy Addendum Urine pregnancy test, quantitative serum hCG negative     Hyponatremia -Likely pseudohyponatremia due to hyperglycemia at the time of admission.  Sodium 129, CBG 455 -Sodium improved to 137  Hypokalemia -Replace  Obesity Estimated body mass index is 38.41 kg/m as calculated from the following:   Height as of this encounter: 5' 2"  (1.575 m).   Weight as of this encounter: 95.3 kg.  Code Status: Full CODE STATUS DVT Prophylaxis:  Lovenox Family Communication: Discussed all imaging results, lab results, explained to the patient   Disposition Plan: Patient from home.  Likely will be discharged home once blood cultures and sensitivities available and pain is controlled.  Hopefully next 24 to 48 hours  Time Spent in minutes 35 minutes  Procedures:  None  Consultants:   None  Antimicrobials:   Anti-infectives (From admission, onward)   Start     Dose/Rate Route Frequency Ordered Stop   08/27/19 0600  cefTRIAXone (ROCEPHIN) 2 g in sodium chloride 0.9 % 100 mL IVPB     2 g 200 mL/hr over 30 Minutes Intravenous Daily 08/26/19 0728     08/26/19 0530  cefTRIAXone (ROCEPHIN) 1 g in sodium chloride 0.9 % 100 mL IVPB  Status:  Discontinued     1 g 200 mL/hr over 30 Minutes Intravenous  Once 08/26/19 0516 08/26/19 0517   08/26/19 0530  cefTRIAXone (ROCEPHIN) 1 g in sodium chloride 0.9 % 100 mL IVPB  Status:  Discontinued     1 g 200 mL/hr over 30  Minutes Intravenous Daily 08/26/19 0517 08/26/19 0728          Medications  Scheduled Meds: . enoxaparin (LOVENOX) injection  40 mg Subcutaneous Q24H  . insulin aspart  0-15 Units Subcutaneous TID WC  . insulin aspart  0-5 Units Subcutaneous QHS  . insulin aspart  5 Units Subcutaneous TID WC  . insulin glargine  30 Units Subcutaneous Daily  . insulin starter kit- syringes  1 kit Other Once  . living well with diabetes book   Does not apply Once   Continuous Infusions: . sodium chloride Stopped (08/27/19 0515)  . cefTRIAXone (ROCEPHIN)  IV Stopped (08/27/19 0545)   PRN Meds:.acetaminophen, HYDROmorphone (DILAUDID) injection, ondansetron (ZOFRAN) IV, oxyCODONE-acetaminophen      Subjective:   Shelley Payne was seen and examined today.  Still complaining of pain not well controlled, 7/10, appears uncomfortable, periumbilical radiating to left flank, sharp and constant.  Low-grade temp of 99.3 F.  No nausea vomiting or any diarrhea.  Patient denies dizziness, chest pain, shortness of breath,  new weakness, numbess, tingling.   Objective:   Vitals:   08/26/19 1006 08/26/19 1524 08/26/19 2008 08/27/19 0510  BP: 123/80 115/87 112/74 98/61  Pulse: 95 100 96 84  Resp: 19 17 20 16   Temp: 96.7 F (37.4 C) 97.6 F (36.4 C) 99.3 F (37.4 C) 99.1 F (37.3 C)  TempSrc: Oral Oral Oral Oral  SpO2: 100% 100% 99% 98%  Weight:      Height:        Intake/Output Summary (Last 24 hours) at 08/27/2019 8938 Last data filed at 08/27/2019 0600 Gross per 24 hour  Intake 2029.12 ml  Output --  Net 2029.12 ml     Wt Readings from Last 3 Encounters:  08/26/19 95.3 kg  08/20/19 95.5 kg  07/17/19 97.9 kg     Exam  General: Alert and oriented x 3, NAD, uncomfortable  Eyes:   HEENT:  Atraumatic, normocephalic  Cardiovascular: S1 S2 auscultated, no murmurs, RRR  Respiratory: Clear to auscultation bilaterally, no wheezing, rales or rhonchi  Gastrointestinal: Soft,  periumbilical tenderness radiating to right side, nondistended, + bowel sounds  Ext: no pedal edema bilaterally  Neuro: No new deficits  Musculoskeletal: No digital cyanosis, clubbing  Skin: No rashes  Psych: Normal affect and demeanor, alert and oriented x3    Data Reviewed:  I have personally reviewed following labs and imaging studies  Micro Results Recent Results (from the past 240 hour(s))  Blood Culture (routine x 2)     Status: None (Preliminary result)   Collection Time: 08/26/19  5:51 AM   Specimen: BLOOD  Result Value Ref Range Status   Specimen Description  Final    BLOOD RIGHT ANTECUBITAL Performed at [facility] [address] [place], [place] [address]    Special Requests   Final    BOTTLES DRAWN AEROBIC AND ANAEROBIC Blood Culture results may not be optimal due to an excessive volume of blood received in culture bottles Performed at [facility] [address] [place], [place] [address]    Culture  Setup Time   Final    GRAM NEGATIVE RODS IN BOTH AEROBIC AND ANAEROBIC BOTTLES CRITICAL RESULT CALLED TO, READ BACK BY AND VERIFIED WITH: J. GRIMSLEY,PHARMD 2248 08/26/2019 T. TYSOR    Culture   Final    GRAM NEGATIVE RODS IDENTIFICATION AND SUSCEPTIBILITIES TO FOLLOW Performed at [facility], [place] [address] [place], [place] [address]    Report Status PENDING  Incomplete  Blood Culture ID Panel (Reflexed)     Status: None   Collection Time: 08/26/19  5:51 AM  Result Value Ref Range Status   Enterococcus species NOT DETECTED NOT DETECTED Final   Listeria monocytogenes NOT DETECTED NOT DETECTED Final   Staphylococcus species NOT DETECTED NOT DETECTED Final   Staphylococcus aureus (BCID) NOT DETECTED NOT DETECTED Final   Streptococcus species NOT DETECTED NOT DETECTED Final   Streptococcus agalactiae NOT DETECTED NOT DETECTED Final   Streptococcus pneumoniae NOT DETECTED NOT DETECTED Final   Streptococcus  pyogenes NOT DETECTED NOT DETECTED Final   Acinetobacter baumannii NOT DETECTED NOT DETECTED Final   Enterobacteriaceae species NOT DETECTED NOT DETECTED Final   Enterobacter cloacae complex NOT DETECTED NOT DETECTED Final   Escherichia coli NOT DETECTED NOT DETECTED Final   Klebsiella oxytoca NOT DETECTED NOT DETECTED Final   Klebsiella pneumoniae NOT DETECTED NOT DETECTED Final   Proteus species NOT DETECTED NOT DETECTED Final   Serratia marcescens NOT DETECTED NOT DETECTED Final   Haemophilus influenzae NOT DETECTED NOT DETECTED Final   Neisseria meningitidis NOT DETECTED NOT DETECTED Final   Pseudomonas aeruginosa NOT DETECTED NOT DETECTED Final   Candida albicans NOT DETECTED NOT DETECTED Final   Candida glabrata NOT DETECTED NOT DETECTED Final   Candida krusei NOT DETECTED NOT DETECTED Final   Candida parapsilosis NOT DETECTED NOT DETECTED Final   Candida tropicalis NOT DETECTED NOT DETECTED Final    Comment: Performed at [facility], [place] [address], [place], [place] [address]  Blood Culture (routine x 2)     Status: None (Preliminary result)   Collection Time: 08/26/19  5:52 AM   Specimen: BLOOD  Result Value Ref Range Status   Specimen Description   Final    BLOOD LEFT ANTECUBITAL Performed at [facility] [address] [place], [place] [address]    Special Requests   Final    BOTTLES DRAWN AEROBIC AND ANAEROBIC Blood Culture results may not be optimal due to an excessive volume of blood received in culture bottles Performed at [facility] [address], [place], [place] [address]    Culture  Setup Time   Final    GRAM NEGATIVE RODS AEROBIC BOTTLE ONLY CRITICAL RESULT CALLED TO, READ BACK BY AND VERIFIED WITH: J. GRIMSLEY,PHARMD 2248 08/26/2019 T. TYSOR    Culture   Final    GRAM NEGATIVE RODS IDENTIFICATION AND SUSCEPTIBILITIES TO FOLLOW Performed at [facility], [place] [address] [place], [place] [address]     Report Status PENDING  Incomplete  Urine culture     Status: Abnormal   Collection Time: 08/26/19  5:52 AM   Specimen: In/Out Cath Urine  Result Value Ref Range Status   Specimen Description   Final    IN/OUT CATH URINE Performed at [facility], [place] [address], [place], [place] [address]    Special Requests   Final    NONE Performed at [facility], [place] [address] [place], [place] [address]    Culture MULTIPLE SPECIES PRESENT, SUGGEST RECOLLECTION (A)  Final   Report Status 08/27/2019 FINAL  Final  SARS CORONAVIRUS 2 (TAT 6-24 HRS) Nasopharyngeal Nasopharyngeal Swab     Status: None   Collection Time: 08/26/19  9:16 AM   Specimen: Nasopharyngeal Swab  Result Value Ref Range Status   SARS Coronavirus 2 NEGATIVE NEGATIVE Final    Comment: (NOTE) SARS-CoV-2 target nucleic acids are NOT DETECTED. The SARS-CoV-2 RNA is generally detectable in upper and lower respiratory specimens during the acute phase of infection. Negative results do not preclude SARS-CoV-2 infection, do not rule out co-infections with other pathogens, and should not be used as the sole basis for treatment or other patient management decisions. Negative results must be combined with clinical observations, patient history, and epidemiological information. The expected result is Negative. Fact Sheet for Patients: SugarRoll.be Fact Sheet for Healthcare Providers: https://www.woods-mathews.com/ This test is not yet approved or cleared by the [place] FDA and  has been authorized for detection and/or diagnosis of SARS-CoV-2 by FDA under an Emergency Use Authorization (EUA). This EUA will remain  in effect (meaning this test can be used) for the duration of the COVID-19 declaration under Section 56 4(b)(1) of the Act, 21 U.S.C. section 360bbb-3(b)(1), unless the authorization is terminated or revoked sooner. Performed at [facility], [place] [address] [place], [place] [address]     Radiology Reports CT ABDOMEN PELVIS W CONTRAST  Result Date: 08/26/2019 CLINICAL DATA:  Right lower quadrant and periumbilical abdominal pain. EXAM: CT ABDOMEN AND PELVIS WITH CONTRAST TECHNIQUE: Multidetector CT imaging of the abdomen and pelvis was performed using the standard protocol following bolus administration of intravenous contrast. CONTRAST:  173m OMNIPAQUE IOHEXOL 300 MG/ML  SOLN COMPARISON:  CT abdomen pelvis 05/12/2018 FINDINGS: Lower chest: Normal heart size. Dependent atelectasis right lower lobe. No pleural effusion. Hepatobiliary: The liver is normal in size and contour. Gallbladder is unremarkable. Pancreas: Unremarkable Spleen: Unremarkable Adrenals/Urinary Tract: Normal adrenal glands. The right kidney is enlarged. There is patchy hypoenhancement involving the right kidney with perinephric fat stranding. There is urothelial enhancement involving the majority of the right ureter. Left kidney is unremarkable. Urinary bladder is unremarkable. Stomach/Bowel: No abnormal bowel wall thickening or evidence for bowel obstruction. No free fluid or free intraperitoneal air. Normal morphology of the stomach. Normal appendix. Vascular/Lymphatic: Normal caliber abdominal aorta. No retroperitoneal lymphadenopathy. Reproductive: Uterus and adnexal structures are unremarkable. Other: None. Musculoskeletal: No aggressive or acute appearing osseous lesions. IMPRESSION: The right kidney is enlarged with fairly extensive patchy areas of hypoenhancement. There is a small amount of perinephric fat stranding. There is hyperenhancement of the urothelium involving the right ureter. Overall findings are concerning for ascending urinary tract infection and associated pyelonephritis of the right kidney. Electronically Signed   By: DLovey NewcomerM.D.   On: 08/26/2019 04:38    Lab Data:  CBC: Recent Labs  Lab 08/20/19 1055 08/26/19 0226 08/27/19 0432    WBC 11.9* 22.1* 16.1*  HGB 13.7 11.5* 9.9*  HCT 43.7 35.2* 32.0*  MCV 80 77.9* 81.0  PLT 232 351 361  Basic Metabolic Panel: Recent Labs  Lab 08/26/19 0226 08/27/19 0432  NA 129* 137  K 3.9 3.2*  CL 92* 101  CO2 23 23  GLUCOSE 455* 188*  BUN 12 6  CREATININE 0.76 0.56  CALCIUM 9.0 7.9*   GFR: Estimated Creatinine Clearance: 115.7 mL/min (by C-G formula based on SCr of 0.56 mg/dL). Liver Function Tests: Recent Labs  Lab 08/26/19 0226  AST 18  ALT 26  ALKPHOS 114  BILITOT 0.6  PROT 8.1  ALBUMIN 2.9*   Recent Labs  Lab 08/26/19 0226  LIPASE 24   No results for input(s): AMMONIA in the last 168 hours. Coagulation Profile: Recent Labs  Lab 08/26/19 0551  INR 1.1   Cardiac Enzymes: No results for input(s): CKTOTAL, CKMB, CKMBINDEX, TROPONINI in the last 168 hours. BNP (last 3 results) No results for input(s): PROBNP in the last 8760 hours. HbA1C: Recent Labs    08/26/19 0854  HGBA1C 12.1*   CBG: Recent Labs  Lab 08/26/19 1221 08/26/19 1624 08/26/19 2010 08/27/19 0751  GLUCAP 279* 188* 203* 181*   Lipid Profile: No results for input(s): CHOL, HDL, LDLCALC, TRIG, CHOLHDL, LDLDIRECT in the last 72 hours. Thyroid Function Tests: No results for input(s): TSH, T4TOTAL, FREET4, T3FREE, THYROIDAB in the last 72 hours. Anemia Panel: No results for input(s): VITAMINB12, FOLATE, FERRITIN, TIBC, IRON, RETICCTPCT in the last 72 hours. Urine analysis:    Component Value Date/Time   COLORURINE YELLOW 08/26/2019 0401   APPEARANCEUR HAZY (A) 08/26/2019 0401   LABSPEC 1.015 08/26/2019 0401   PHURINE 6.0 08/26/2019 0401   GLUCOSEU >=500 (A) 08/26/2019 0401   HGBUR LARGE (A) 08/26/2019 0401   BILIRUBINUR NEGATIVE 08/26/2019 0401   BILIRUBINUR negative 08/20/2019 0950   KETONESUR NEGATIVE 08/26/2019 0401   PROTEINUR NEGATIVE 08/26/2019 0401   UROBILINOGEN 0.2 08/20/2019 0950   NITRITE NEGATIVE 08/26/2019 0401   LEUKOCYTESUR SMALL (A) 08/26/2019 0401      Broderic Bara M.D. [facility] 08/27/2019, 9:54 AM   Call night coverage person covering after 7pm

## 2019-08-27 NOTE — Progress Notes (Signed)
Inpatient Diabetes Program Recommendations  AACE/ADA: New Consensus Statement on Inpatient Glycemic Control (2015)  Target Ranges:  Prepandial:   less than 140 mg/dL      Peak postprandial:   less than 180 mg/dL (1-2 hours)      Critically ill patients:  140 - 180 mg/dL   Lab Results  Component Value Date   GLUCAP 256 (H) 08/27/2019   HGBA1C 12.1 (H) 08/26/2019    Review of Glycemic Control  Spoke with pt at length regarding her diabetes and HgbA1C of 12.1%. Pt states she's had diabetes since age 24. Was pt of Dr Loren Racer and now only has PCP for birth control. We discussed importance of controlling blood sugars to reduce risk of complications from diabetes. Pt states she doesn't pay attention to her diabetes, said she just needed to lose a little weight. States she has meter at home, but doesn't know where it is. Long discussion about HgbA1C of 12.1% (average daily blood sugar of 300 mg/dL) and the need for insulin at discharge. She appears depressed and seems like she is self-sabotaging.Discussed healthy eating with emphasis on lower CHOs and portion control, importance of exercise (walking) starting off at 15-20 min/day, and stress management, to control blood sugars.  Pt states she has no questions. Has not had good appetite and ate very little at breakfast and none for lunch.   Educated pt on insulin pen use at home. Reviewed all steps if insulin pen including attachment of needle, 2-unit air shot, dialing up dose, giving injection, removing needle, disposal of sharps, storage of unused insulin, disposal of insulin etc. Pt states she knows how to use an insulin pen and had learned "a long time ago." Also pt was on Victoza at some point. Had stopped taking all diabetes meds about 10 years ago.   Blood sugars today: 188, 181, 256 mg/dL. Pt has no insurance at present.  Inpatient Diabetes Program Recommendations:     At discharge:  Novolin (ReliOn) pen 70/30 21 units bid Novolin R  pen sliding scale (0-15 units tidwc) for glucose excursions. Will need prescription for pen needles and glucose meter kit.  Will continue to follow.  Thank you. Lorenda Peck, RD, LDN, CDE Inpatient Diabetes Coordinator [phone]

## 2019-08-27 NOTE — Progress Notes (Signed)
PHARMACY - PHYSICIAN COMMUNICATION CRITICAL VALUE ALERT - BLOOD CULTURE IDENTIFICATION (BCID)  Atticus Plaugher is an 24 y.o. female who presented to [facility] on 08/26/2019 with a chief complaint of abdominal pain  Assessment:  Patient with GNR on micro but NO result on BCID.  (include suspected source if known)  Name of physician (or Provider) Contacted: X. Blount  Current antibiotics: Ceftriaxone 2gm iv q24h  Changes to prescribed antibiotics recommended:  Patient is on recommended antibiotics - No changes needed  Since no firm culture result to act on.    Results for orders placed or performed during the hospital encounter of 08/26/19  Blood Culture ID Panel (Reflexed) (Collected: 08/26/2019  5:51 AM)  Result Value Ref Range   Enterococcus species NOT DETECTED NOT DETECTED   Listeria monocytogenes NOT DETECTED NOT DETECTED   Staphylococcus species NOT DETECTED NOT DETECTED   Staphylococcus aureus (BCID) NOT DETECTED NOT DETECTED   Streptococcus species NOT DETECTED NOT DETECTED   Streptococcus agalactiae NOT DETECTED NOT DETECTED   Streptococcus pneumoniae NOT DETECTED NOT DETECTED   Streptococcus pyogenes NOT DETECTED NOT DETECTED   Acinetobacter baumannii NOT DETECTED NOT DETECTED   Enterobacteriaceae species NOT DETECTED NOT DETECTED   Enterobacter cloacae complex NOT DETECTED NOT DETECTED   Escherichia coli NOT DETECTED NOT DETECTED   Klebsiella oxytoca NOT DETECTED NOT DETECTED   Klebsiella pneumoniae NOT DETECTED NOT DETECTED   Proteus species NOT DETECTED NOT DETECTED   Serratia marcescens NOT DETECTED NOT DETECTED   Haemophilus influenzae NOT DETECTED NOT DETECTED   Neisseria meningitidis NOT DETECTED NOT DETECTED   Pseudomonas aeruginosa NOT DETECTED NOT DETECTED   Candida albicans NOT DETECTED NOT DETECTED   Candida glabrata NOT DETECTED NOT DETECTED   Candida krusei NOT DETECTED NOT DETECTED   Candida parapsilosis NOT DETECTED NOT DETECTED   Candida tropicalis  NOT DETECTED NOT DETECTED    Aleene Davidson [place] 08/27/2019  6:07 AM

## 2019-08-28 ENCOUNTER — Ambulatory Visit (HOSPITAL_COMMUNITY): Payer: Medicaid Other

## 2019-08-28 LAB — CBC
HCT: 33.7 % — ABNORMAL LOW (ref 36.0–46.0)
Hemoglobin: 10.3 g/dL — ABNORMAL LOW (ref 12.0–15.0)
MCH: 25.2 pg — ABNORMAL LOW (ref 26.0–34.0)
MCHC: 30.6 g/dL (ref 30.0–36.0)
MCV: 82.6 fL (ref 80.0–100.0)
Platelets: 351 10*3/uL (ref 150–400)
RBC: 4.08 MIL/uL (ref 3.87–5.11)
RDW: 14.8 % (ref 11.5–15.5)
WBC: 13.5 10*3/uL — ABNORMAL HIGH (ref 4.0–10.5)
nRBC: 0 % (ref 0.0–0.2)

## 2019-08-28 LAB — BASIC METABOLIC PANEL
Anion gap: 10 (ref 5–15)
BUN: 8 mg/dL (ref 6–20)
CO2: 23 mmol/L (ref 22–32)
Calcium: 8.5 mg/dL — ABNORMAL LOW (ref 8.9–10.3)
Chloride: 107 mmol/L (ref 98–111)
Creatinine, Ser: 0.66 mg/dL (ref 0.44–1.00)
GFR calc Af Amer: >60 mL/min (ref >60)
GFR calc non Af Amer: >60 mL/min (ref >60)
Glucose, Bld: 205 mg/dL — ABNORMAL HIGH (ref 70–99)
Potassium: 3.7 mmol/L (ref 3.5–5.1)
Sodium: 140 mmol/L (ref 135–145)

## 2019-08-28 LAB — CULTURE, BLOOD (ROUTINE X 2)

## 2019-08-28 LAB — GLUCOSE, CAPILLARY
Glucose-Capillary: 160 mg/dL — ABNORMAL HIGH (ref 70–99)
Glucose-Capillary: 219 mg/dL — ABNORMAL HIGH (ref 70–99)

## 2019-08-28 MED ORDER — OXYCODONE-ACETAMINOPHEN 5-325 MG PO TABS: 1.0000 | ORAL_TABLET | Freq: Three times a day (TID) | ORAL | 0 refills | Status: AC | PRN

## 2019-08-28 MED ORDER — CIPROFLOXACIN HCL 500 MG PO TABS: 500.0000 mg | ORAL_TABLET | Freq: Two times a day (BID) | ORAL | 0 refills | Status: AC

## 2019-08-28 MED ORDER — POLYETHYLENE GLYCOL 3350 17 G PO PACK
17.0000 g | PACK | Freq: Every day | ORAL | Status: DC
  Administered 2019-08-28: 17 g via ORAL
  Filled 2019-08-28: qty 1

## 2019-08-28 MED ORDER — ACCU-CHEK MULTICLIX LANCETS MISC: 0 refills | Status: DC

## 2019-08-28 MED ORDER — NOVOLIN R FLEXPEN RELION 100 UNIT/ML IJ SOPN: 0.0000 [IU] | PEN_INJECTOR | Freq: Three times a day (TID) | INTRAMUSCULAR | 4 refills | Status: DC

## 2019-08-28 MED ORDER — NOVOLIN 70/30 FLEXPEN RELION (70-30) 100 UNIT/ML ~~LOC~~ SUPN: 25.0000 [IU] | PEN_INJECTOR | Freq: Two times a day (BID) | SUBCUTANEOUS | 5 refills | Status: DC

## 2019-08-28 MED ORDER — "PEN NEEDLES 3/16"" 31G X 5 MM MISC": 25.0000 [IU] | Freq: Two times a day (BID) | 3 refills | Status: DC

## 2019-08-28 MED ORDER — BLOOD GLUCOSE MONITOR KIT: PACK | 0 refills | Status: DC

## 2019-08-28 MED ORDER — INSULIN GLARGINE 100 UNIT/ML ~~LOC~~ SOLN
35.0000 [IU] | Freq: Every day | SUBCUTANEOUS | Status: DC
  Administered 2019-08-28: 35 [IU] via SUBCUTANEOUS
  Filled 2019-08-28: qty 0.35

## 2019-08-28 MED ORDER — INSULIN ASPART 100 UNIT/ML ~~LOC~~ SOLN
7.0000 [IU] | Freq: Three times a day (TID) | SUBCUTANEOUS | Status: DC
  Administered 2019-08-28 (×2): 7 [IU] via SUBCUTANEOUS

## 2019-08-28 NOTE — Discharge Instructions (Signed)
Diabetes Label Reading Tips Check the Nutrition Facts on food labels for nutrient information for the food. The Nutrition Facts information is based on a standard serving size.  However, that serving size may not be the same as the serving size used in carbohydrate counting. Always start by checking the serving size on the label.  Is this the serving size you will be eating?  How many servings are in the package? Next, look at the total carbohydrate.  It is measured in grams (g). To find the number of carbohydrate servings in 1 standard serving of a food, divide the total grams of carbohydrate by 15.  One (1) carbohydrate serving is the amount of food with 15 g carbohydrate. You don't need to count grams of sugars. They are included in the total carbohydrate. The label shows how many calories are in the standard serving.  It also lists the amount of fat, cholesterol, sodium, protein, and some vitamins and minerals.  Talk to your registered dietitian nutritionist or diabetes educator to learn about your goals for these nutrients. Look below the line listing total fat to find out how much of that fat is saturated fat or trans fat.  Choose foods that are low in these kinds of fats because they are not healthy for your heart.  In the foods that are healthiest for your heart, grams of saturated fat and trans fat are less than one-third of the total fat grams. If foods are very low in calories (less than 20 calories per serving) or carbohydrates (5 g carbohydrate or less per serving), you may not need to count them when you count carbohydrates.     Carbohydrate Counting for People with Diabetes Foods with carbohydrates make your blood glucose level go up.  Learning how to count carbohydrates can help you control your blood glucose levels.  First, identify the foods you eat that contain carbohydrates.  Then, using the Foods with Carbohydrates chart, determine about how much carbohydrates are in  your meals and snacks.  Make sure you are eating foods with fiber, protein, and healthy fat along with your carbohydrate foods.  Foods with Carbohydrates The following table shows carbohydrate foods that have about 15 grams of carbohydrate each.  Using measuring cups, spoons, or a food scale when you first begin learning about carbohydrate counting can help you learn about the portion sizes you typically eat. The following foods have 15 grams carbohydrate each: Grains 1 slice bread (1 ounce) 1 small tortilla (6-inch size)  large bagel (1 ounce) 1/3 cup pasta or rice (cooked)  hamburger or hot dog bun ( ounce)  cup cooked cereal  to  cup ready-to-eat cereal 2 taco shells (5-inch size) Fruit 1 small fresh fruit ( to 1 cup)  medium banana 17 small grapes (3 ounces) 1 cup melon or berries  cup canned or frozen fruit 2 tablespoons dried fruit (blueberries, cherries, cranberries, raisins)  cup unsweetened fruit juice Starchy Vegetables  cup cooked beans, peas, corn, potatoes/sweet potatoes  large baked potato (3 ounces) 1 cup acorn or butternut squash Snack Foods 3 to 6 crackers 8 potato chips or 13 tortilla chips ( ounce to 1 ounce) 3 cups popped popcorn Dairy 3/4 cup (6 ounces) nonfat plain yogurt, or yogurt with sugar-free sweetener 1 cup milk 1 cup plain rice, soy, coconut or flavored almond milk Sweets and Desserts  cup ice cream or frozen yogurt 1 tablespoon jam, jelly, pancake syrup, table sugar, or honey 2 tablespoons light pancake syrup 1  inch square of frosted cake or 2 inch square of unfrosted cake 2 small cookies (2/3 ounce each) or  large cookie  Sometimes you'll have to estimate carbohydrate amounts if you don't know the exact recipe.  One cup of mixed foods like soups can have 1 to 2 carbohydrate servings, while some casseroles might have 2 or more servings of carbohydrate. Foods that have less than 20 calories in each serving can be counted  as "free" foods.  Count 1 cup raw vegetables, or  cup cooked non-starchy vegetables as "free" foods.  If you eat 3 or more servings at one meal, then count them as 1 carbohydrate serving.  Foods without Carbohydrates Not all foods contain carbohydrates.  Meat, some dairy, fats, non-starchy vegetables, and many beverages don't contain carbohydrate.  So when you count carbohydrates, you can generally exclude chicken, pork, beef, fish, seafood, eggs, tofu, cheese, butter, sour cream, avocado, nuts, seeds, olives, mayonnaise, water, black coffee, unsweetened tea, and zero-calorie drinks.  Vegetables with no or low carbohydrate include green beans, cauliflower, tomatoes, and onions.  How much carbohydrate should I eat at each meal? Carbohydrate counting can help you plan your meals and manage your weight.  Following are some starting points for carbohydrate intake at each meal.  To Lose Weight, To Maintain Weight Women: 2-3 carb servings, 3-4 carb servings Checking your blood glucose after meals will help you know if you need to adjust the timing, type, or number of carbohydrate servings in your meal plan.  Achieve and keep a healthy body weight by balancing your food intake and physical activity.  How should I plan my meals? Plan for half the food on your plate to include non-starchy vegetables, like salad greens, broccoli, or carrots.  Try to eat 3-5 servings of non-starchy vegetables every day.  Have a protein food at each meal.  Protein foods include chicken, fish, meat, eggs, or beans (note that beans contain carbohydrate).  These two food groups (non-starchy vegetables and proteins) are low in carbohydrate.  If you fill up your plate with these foods, you will eat less carbohydrate but still fill up your stomach.  Try to limit your carbohydrate portion to  of the plate. What fats are healthiest to eat? Diabetes increases risk for heart disease.  To help protect your heart, eat more  healthy fats, such as olive oil, nuts, and avocado.  Eat less saturated fats like butter, cream, and high-fat meats, like bacon and sausage.  Avoid trans fats, which are in all foods that list "partially hydrogenated oil" as an ingredient. What should I drink? Choose drinks that are not sweetened with sugar.  The healthiest choices are water, carbonated or seltzer waters, and tea and coffee without added sugars. Sweet drinks will make your blood glucose go up very quickly.  One serving of soda or energy drink is  cup.  It is best to drink these beverages only if your blood glucose is low. Artificially sweetened, or diet drinks, typically do not increase your blood glucose if they have zero calories in them. Read labels of beverages, as some diet drinks do have carbohydrate and will raise your blood glucose. Label Reading Tips Read Nutrition Facts labels to find out how many grams of carbohydrate are in a food you want to eat.  Don't forget: sometimes serving sizes on the label aren't the same as how much food you are going to eat, so you may need to calculate how much carbohydrate is in the food  you are serving yourself.  Carbohydrate Counting for People with Diabetes Sample 1-Day Menu Breakfast  cup yogurt, low fat, low sugar (1 carbohydrate serving)  cup cereal, ready-to-eat, unsweetened (1 carbohydrate serving) 1 cup strawberries (1 carbohydrate serving)  cup almonds ( carbohydrate serving) Lunch 1, 5 ounce can chunk light tuna 2 ounces cheese, low fat cheddar 6 whole wheat crackers (1 carbohydrate serving) 1 small apple (1 carbohydrate servings)  cup carrots ( carbohydrate serving)  cup snap peas 1 cup 1% milk (1 carbohydrate serving) Evening Meal Stir fry made with: 3 ounces chicken 1 cup brown rice (3 carbohydrate servings)  cup broccoli ( carbohydrate serving)  cup green beans  cup onions 1 tablespoon olive oil 2 tablespoons teriyaki sauce ( carbohydrate  serving) Evening Snack 1 extra small banana (1 carbohydrate serving) 1 tablespoon peanut butter  Carbohydrate Counting for People with Diabetes Vegan Sample 1-Day Menu Breakfast 1 cup cooked oatmeal (2 carbohydrate servings)  cup blueberries (1 carbohydrate serving) 2 tablespoons flaxseeds 1 cup soymilk fortified with calcium and vitamin D 1 cup coffee Lunch 2 slices whole wheat bread (2 carbohydrate servings)  cup baked tofu  cup lettuce 2 slices tomato 2 slices avocado  cup baby carrots ( carbohydrate serving) 1 orange (1 carbohydrate serving) 1 cup soymilk fortified with calcium and vitamin D Evening Meal Burrito made with: 1 6-inch corn tortilla (1 carbohydrate serving) 1 cup refried vegetarian beans (2 carbohydrate servings)  cup chopped tomatoes  cup lettuce  cup salsa 1/3 cup brown rice (1 carbohydrate serving) 1 tablespoon olive oil for rice  cup zucchini Evening Snack 6 small whole grain crackers (1 carbohydrate serving) 2 apricots ( carbohydrate serving)  cup unsalted peanuts ( carbohydrate serving)  Carbohydrate Counting for People with Diabetes Vegetarian (Lacto-Ovo) Sample 1-Day Menu Breakfast 1 cup cooked oatmeal (2 carbohydrate servings)  cup blueberries (1 carbohydrate serving) 2 tablespoons flaxseeds 1 egg 1 cup 1% milk (1 carbohydrate serving) 1 cup coffee Lunch 2 slices whole wheat bread (2 carbohydrate servings) 2 ounces low-fat cheese  cup lettuce 2 slices tomato 2 slices avocado  cup baby carrots ( carbohydrate serving) 1 orange (1 carbohydrate serving) 1 cup unsweetened tea Evening Meal Burrito made with: 1 6-inch corn tortilla (1 carbohydrate serving)  cup refried vegetarian beans (1 carbohydrate serving)  cup tomatoes  cup lettuce  cup salsa 1/3 cup brown rice (1 carbohydrate serving) 1 tablespoon olive oil for rice  cup zucchini 1 cup 1% milk (1 carbohydrate serving) Evening Snack 6 small whole grain  crackers (1 carbohydrate serving) 2 apricots ( carbohydrate serving)  cup unsalted peanuts ( carbohydrate serving)

## 2019-08-28 NOTE — Progress Notes (Signed)
Initial Nutrition Assessment  DOCUMENTATION CODES:   Obesity unspecified  INTERVENTION:  - will place handouts related to DM diet in Discharge Instructions. - recommend referral for outpatient diet education.    NUTRITION DIAGNOSIS:   Altered nutrition lab value related to other (see comment)(hx of uncontrolled type 2 DM, not taking any medications) as evidenced by per patient/family report.  GOAL:   Patient will meet greater than or equal to 90% of their needs  MONITOR:   PO intake, Labs, Weight trends  REASON FOR ASSESSMENT:   Malnutrition Screening Tool, Consult Diet education  ASSESSMENT:   24 y.o. female with medical history significant of type 2 DM not currently taking any medications and morbid obesity. She presented to the ED from home due to abdominal pain which began 4 days PTA and slowly worsened. She reported that pain was sharp in nature and mainly around peri-umbilical area. She was also experiencing chills and N/V. She works at [employer] at [employer].  No intakes documented since admission. Discharge order and summary entered late morning for planned discharge home today. Will place handouts in Discharge Instructions. She was seen by DM Coordinator about 30 minutes ago whose note states patient was mainly disinterested in learning anything about managing DM.   Per chart review, weight on 1/31 was 210 lb and weight on 12/22 was 215 lb. This indicates 5 lb weight loss (2.3% body weight) in the past 5 weeks; not significant for time frame.   Prior to this admission, patient was not seen by a [facility] Dietitian at any time in the past.    Labs reviewed; CBGs: 160 and 219 mg/dl, Ca: 8.5 mg/dl. Medications reviewed; sliding scale novolog, 7 units novolog TID, 35 units lantus/day, 1 packet miralax/day.  IVF; NS @ 100 ml/hr.    NUTRITION - FOCUSED PHYSICAL EXAM:  unable to complete at this time.   Diet Order:   Diet Order            Diet Carb Modified         Diet Carb Modified Fluid consistency: Thin; Room service appropriate? Yes  Diet effective now              EDUCATION NEEDS:   Education needs have been addressed  Skin:  Skin Assessment: Reviewed RN Assessment  Last BM:  1/30  Height:   Ht Readings from Last 1 Encounters:  08/26/19 5\' 2"  (1.575 m)    Weight:   Wt Readings from Last 1 Encounters:  08/26/19 95.3 kg    Ideal Body Weight:  50 kg  BMI:  Body mass index is 38.41 kg/m.  Estimated Nutritional Needs:   Kcal:  1800-2000 kcal  Protein:  80-95 grams  Fluid:  >/= 2 L/day     08/28/19, MS, RD, LDN, [facility] Inpatient Clinical Dietitian Pager # [phone] After hours/weekend pager # [phone]

## 2019-08-28 NOTE — Discharge Summary (Signed)
Physician Discharge Summary   Patient ID: Shelley Payne MRN: [id number] DOB/AGE: [birth year] 24 y.o.  Admit date: 08/26/2019 Discharge date: 08/28/2019  Primary Care Physician:  [name], MD   Recommendations for Outpatient Follow-up:  1. Follow up with PCP in 1-2 weeks 2. Patient recommended to follow-up with PCP as she is now started on insulin.  She was recommended to keep a log of her blood sugars at bedtime and meals for further adjustment of insulin regimen by her PCP. 3. Ciprofloxacin 500 mg twice daily for 10 days for E. coli bacteremia, E. coli pyelonephritis, complicated UTI.  Home Health: None Equipment/Devices:   Discharge Condition: stable  CODE STATUS: FULL  Diet recommendation: Carb modified diet   Discharge Diagnoses:   E. coli bacteremia Acute right-sided pyelonephritis  E. coli UTI . Obesity, morbid ([place]) . Type 2 diabetes mellitus uncontrolled with hyperglycemia   Consults: None    Allergies:  No Known Allergies   DISCHARGE MEDICATIONS: Allergies as of 08/28/2019   No Known Allergies     Medication List    STOP taking these medications   cephALEXin 500 MG capsule Commonly known as: KEFLEX   glucose blood test strip Commonly known as: Accu-Chek Aviva     TAKE these medications   accu-chek multiclix lancets Use as instructed   blood glucose meter kit and supplies Kit Dispense based on patient and insurance preference. Use up to four times daily as directed. (FOR ICD-9 250.00, 250.01).   ciprofloxacin 500 MG tablet Commonly known as: CIPRO Take 1 tablet (500 mg total) by mouth 2 (two) times daily for 10 days.   NovoLIN 70/30 FlexPen Relion (70-30) 100 UNIT/ML PEN Generic drug: Insulin Isophane & Regular Human Inject 25 Units into the skin 2 (two) times daily.   NovoLIN R FlexPen ReliOn 100 UNIT/ML Sopn Generic drug: Insulin Regular Human Inject 0-9 Units as directed 3 (three) times daily with meals. Sliding scale with meals:  CBG 70 - 120: 0 units CBG 121 - 150: 1 unit,  CBG 151 - 200: 2 units,  CBG 201 - 250: 3 units,  CBG 251 - 300: 5 units,  CBG 301 - 350: 7 units,  CBG 351 - 400: 9 units   CBG > 400: 9 units and notify your MD   oxyCODONE-acetaminophen 5-325 MG tablet Commonly known as: PERCOCET/ROXICET Take 1 tablet by mouth every 8 (eight) hours as needed for up to 3 days for moderate pain or severe pain.   Pen Needles 3/16" 31G X 5 MM Misc 25 Units by Does not apply route 2 (two) times daily. PEN NEEDLES        Brief H and P: For complete details please refer to admission H and P, but in brief Patient is a 24 year old female with history of diabetes mellitus type 2, currently not taking any medications, obesity presented from home with abdominal pain.  Patient reported that pain started 4 days ago in the periumbilic patient is a al area, radiating to right flank, sharp, and severe.  Pain worse when she tries to urinate or have a BM.  Denied any back pain or dysuria.  She was seen by her PCP a few days ago and was prescribed Keflex for the same problem.  Patient has not picked up the medications.  Denies any history of vaginal discharge or bleeding.  Patient also reported chills at home, no chest pain, shortness of breath. CT abdomen showed right-sided pyelonephritis.  Pelvic exam done in ED showed  no concerns for PID.  Hospital Course:  Acute right-sided pyelonephritis with UTI, E. coli bacteremia, sepsis -Patient met sepsis criteria at the time of admission with tachycardia, leukocytosis, so secondary to UTI/pyelonephritis -Urine culture shows multiple species, blood culture however E. coli bacteremia  -Patient was placed on Rocephin 2 g IV daily.  Per sensitivities, transition to oral ciprofloxacin to complete full course (cheaper, financial reasons).    Type 2 diabetes mellitus with hyperglycemia, without long-term current use of insulin (HCC) -Uncontrolled, hemoglobin A1c 12.1, CBG 455 at the time of  admission -Patient states that she used to be on Metformin, could not remember any other agent.  Unclear why she stopped her oral hypoglycemics -Diabetic coordinator consult was placed, nutritionist consult placed for diet education with uncontrolled diabetes mellitus -Patient was started on Lantus, meal coverage with NovoLog, sliding scale insulin while inpatient. -Discussed with diabetic coordinator in detail, financial reasons, placed on [employer] ReliOn brand 70/30 insulin 25 units twice daily and sliding scale insulin ReliOn Novolin R, sensitive.  Patient was strongly recommended to be compliant with her insulin and follow-up with her PCP.   I-STAT beta-hCG positive -I-STAT hCG 14.3 -Urine pregnancy test, quantitative serum hCG negative     Hyponatremia -Likely pseudohyponatremia due to hyperglycemia at the time of admission.  Sodium 129, CBG 455 -Sodium improved to 140  Hypokalemia -Replaced on admission  Obesity Estimated body mass index is 38.41 kg/m as calculated from the following:   Height as of this encounter: 5' 2"  (1.575 m).   Weight as of this encounter: 95.3 kg. Counseled for diet and weight control   Day of Discharge S: Feels better, right-sided abdominal pain and flank pain improving.  No nausea vomiting or acute fevers.  Feels like she can manage at home now, wants to be discharged  BP 111/74 (BP Location: Left Arm)   Pulse 83   Temp 99 F (37.2 C) (Oral)   Resp 15   Ht 5' 2"  (1.575 m)   Wt 95.3 kg   SpO2 97%   BMI 38.41 kg/m   Physical Exam: General: Alert and awake oriented x3 not in any acute distress. HEENT: anicteric sclera, pupils reactive to light and accommodation CVS: S1-S2 clear no murmur rubs or gallops Chest: clear to auscultation bilaterally, no wheezing rales or rhonchi Abdomen: soft mild TTP right sided, nondistended, normal bowel sounds Extremities: no cyanosis, clubbing or edema noted bilaterally Neuro: Cranial nerves II-XII  intact, no focal neurological deficits    Get Medicines reviewed and adjusted: Please take all your medications with you for your next visit with your Primary MD  Please request your Primary MD to go over all hospital tests and procedure/radiological results at the follow up. Please ask your Primary MD to get all Hospital records sent to his/her office.  If you experience worsening of your admission symptoms, develop shortness of breath, life threatening emergency, suicidal or homicidal thoughts you must seek medical attention immediately by calling 911 or calling your MD immediately  if symptoms less severe.  You must read complete instructions/literature along with all the possible adverse reactions/side effects for all the Medicines you take and that have been prescribed to you. Take any new Medicines after you have completely understood and accept all the possible adverse reactions/side effects.   Do not drive when taking pain medications.   Do not take more than prescribed Pain, Sleep and Anxiety Medications  Special Instructions: If you have smoked or chewed Tobacco  in the last 2  yrs please stop smoking, stop any regular Alcohol  and or any Recreational drug use.  Wear Seat belts while driving.  Please note  You were cared for by a hospitalist during your hospital stay. Once you are discharged, your primary care physician will handle any further medical issues. Please note that NO REFILLS for any discharge medications will be authorized once you are discharged, as it is imperative that you return to your primary care physician (or establish a relationship with a primary care physician if you do not have one) for your aftercare needs so that they can reassess your need for medications and monitor your lab values.   The results of significant diagnostics from this hospitalization (including imaging, microbiology, ancillary and laboratory) are listed below for reference.       Procedures/Studies:  CT ABDOMEN PELVIS W CONTRAST  Result Date: 08/26/2019 CLINICAL DATA:  Right lower quadrant and periumbilical abdominal pain. EXAM: CT ABDOMEN AND PELVIS WITH CONTRAST TECHNIQUE: Multidetector CT imaging of the abdomen and pelvis was performed using the standard protocol following bolus administration of intravenous contrast. CONTRAST:  148m OMNIPAQUE IOHEXOL 300 MG/ML  SOLN COMPARISON:  CT abdomen pelvis 05/12/2018 FINDINGS: Lower chest: Normal heart size. Dependent atelectasis right lower lobe. No pleural effusion. Hepatobiliary: The liver is normal in size and contour. Gallbladder is unremarkable. Pancreas: Unremarkable Spleen: Unremarkable Adrenals/Urinary Tract: Normal adrenal glands. The right kidney is enlarged. There is patchy hypoenhancement involving the right kidney with perinephric fat stranding. There is urothelial enhancement involving the majority of the right ureter. Left kidney is unremarkable. Urinary bladder is unremarkable. Stomach/Bowel: No abnormal bowel wall thickening or evidence for bowel obstruction. No free fluid or free intraperitoneal air. Normal morphology of the stomach. Normal appendix. Vascular/Lymphatic: Normal caliber abdominal aorta. No retroperitoneal lymphadenopathy. Reproductive: Uterus and adnexal structures are unremarkable. Other: None. Musculoskeletal: No aggressive or acute appearing osseous lesions. IMPRESSION: The right kidney is enlarged with fairly extensive patchy areas of hypoenhancement. There is a small amount of perinephric fat stranding. There is hyperenhancement of the urothelium involving the right ureter. Overall findings are concerning for ascending urinary tract infection and associated pyelonephritis of the right kidney. Electronically Signed   By: DLovey NewcomerM.D.   On: 08/26/2019 04:38       LAB RESULTS: Basic Metabolic Panel: Recent Labs  Lab 08/27/19 0432 08/28/19 0415  NA 137 140  K 3.2* 3.7  CL 101 107   CO2 23 23  GLUCOSE 188* 205*  BUN 6 8  CREATININE 0.56 0.66  CALCIUM 7.9* 8.5*   Liver Function Tests: Recent Labs  Lab 08/26/19 0226  AST 18  ALT 26  ALKPHOS 114  BILITOT 0.6  PROT 8.1  ALBUMIN 2.9*   Recent Labs  Lab 08/26/19 0226  LIPASE 24   No results for input(s): AMMONIA in the last 168 hours. CBC: Recent Labs  Lab 08/27/19 0432 08/27/19 0432 08/28/19 0415  WBC 16.1*  --  13.5*  HGB 9.9*  --  10.3*  HCT 32.0*  --  33.7*  MCV 81.0   < > 82.6  PLT 361  --  351   < > = values in this interval not displayed.   Cardiac Enzymes: No results for input(s): CKTOTAL, CKMB, CKMBINDEX, TROPONINI in the last 168 hours. BNP: Invalid input(s): POCBNP CBG: Recent Labs  Lab 08/28/19 0746 08/28/19 1115  GLUCAP 160* 219*       Disposition and Follow-up: Discharge Instructions    Diet Carb Modified  Complete by: As directed    Discharge instructions   Complete by: As directed    It is VERY IMPORTANT that you follow up with a PCP on a regular basis.  Check your blood glucoses before each meal and at bedtime and maintain a log of your readings.  Bring this log with you when you follow up with your PCP so that he or she can adjust your insulin at your follow up visit.   Increase activity slowly   Complete by: As directed        DISPOSITION: home    [place]    [name], MD. Schedule an appointment as soon as possible for a visit in 1 week(s).   Specialty: Family Medicine Contact information: [address] [place] [address] [phone]            Time coordinating discharge:  35 mins   Signed:   Estill Cotta M.D. [facility] 08/28/2019, 11:39 AM

## 2019-08-28 NOTE — Progress Notes (Signed)
Inpatient Diabetes Program Recommendations  AACE/ADA: New Consensus Statement on Inpatient Glycemic Control (2015)  Target Ranges:  Prepandial:   less than 140 mg/dL      Peak postprandial:   less than 180 mg/dL (1-2 hours)      Critically ill patients:  140 - 180 mg/dL   Lab Results  Component Value Date   GLUCAP 219 (H) 08/28/2019   HGBA1C 12.1 (H) 08/26/2019    Review of Glycemic Control  Pt to be discharged on Novolin ReliOn 70/30 25 units bid and Novolin R 0-9 units tidwc  Reviewed insulin pen administration and pt was able to return demonstration. Discussed hypoglycemia s/s and treatment. Pt appears very disinterested in learning more about taking care of her diabetes. Stressed importance of making appt with PCP to f/u within a week of discharge and take blood sugar log to appt for any needed insulin adjustments. Instructed pt to make certain to eat when taking 70/30 insulin. Discussed s/s insulin 3x/day with meals. Pt had no questions.  Discussed with RN.  Thank you. Ailene Ards, RD, LDN, CDE Inpatient Diabetes Coordinator [phone]

## 2019-10-25 ENCOUNTER — Other Ambulatory Visit: Payer: Self-pay

## 2019-10-25 ENCOUNTER — Ambulatory Visit (INDEPENDENT_AMBULATORY_CARE_PROVIDER_SITE_OTHER): Payer: Self-pay | Admitting: Family Medicine

## 2019-10-25 VITALS — BP 100/70 | HR 82 | Ht 62.0 in | Wt 207.2 lb

## 2019-10-25 DIAGNOSIS — N3001 Acute cystitis with hematuria: Secondary | ICD-10-CM

## 2019-10-25 DIAGNOSIS — E1165 Type 2 diabetes mellitus with hyperglycemia: Secondary | ICD-10-CM

## 2019-10-25 LAB — POCT UA - MICROSCOPIC ONLY

## 2019-10-25 MED ORDER — CEPHALEXIN 250 MG PO CAPS: 250.0000 mg | ORAL_CAPSULE | Freq: Four times a day (QID) | ORAL | 0 refills | Status: DC

## 2019-10-25 MED ORDER — TRESIBA FLEXTOUCH 100 UNIT/ML ~~LOC~~ SOPN: 35.0000 [IU] | PEN_INJECTOR | Freq: Every day | SUBCUTANEOUS | 0 refills | Status: DC

## 2019-10-25 MED ORDER — METFORMIN HCL 500 MG PO TABS: 500.0000 mg | ORAL_TABLET | Freq: Every day | ORAL | 1 refills | Status: DC

## 2019-10-25 NOTE — Patient Instructions (Signed)
It was nice seeing you today Ms. Dial!  We are starting [place] 35 units once daily today.  Please increase this medication by 1 unit each day that your fasting blood sugar is greater than 120.  We are also starting Metformin 500 mg once daily today.  We will slowly increase this medication over time since this medication is very helpful in diabetes.  Please either send me message over MyChart, or I will call you in about 1 week.  We will talk about what your blood sugars have been and how its going with insulin at that time.  I would like to see you back in 1 month to talk more about your diabetes.  For your UTI, I am sending in a medication called Keflex, which she should take 4 times per day for 7 days.  If you develop chills, fever, or back pain, please call our after-hours line or see urgent care.  We are culturing your urine, and if it shows bacteria that is not covered by this antibiotic, I will call you.  If you have any questions or concerns, please feel free to call the clinic.   Be well,  Dr. Frances Furbish

## 2019-10-25 NOTE — Assessment & Plan Note (Signed)
Spoke with our pharmacy staff and agreed to start [place].  Patient appears motivated to treat her diabetes now.  We will start Tresiba 35 units once daily and increase 1 unit for each day that her fasting blood sugar is greater than 120.  We will communicate in 1 week regarding her insulin and blood sugar levels.  She will see me in 1 month for a repeat hemoglobin A1c.  We will also start metformin 500 mg once daily.  We will slowly titrate up.  Patient will try this again despite her previous reticence regarding taking pills.  Discussed with her that I would not advise that she get pregnant currently since uncontrolled type 2 diabetes can be very dangerous for both her and her fetus.  Encouraged her to use this as motivation to get her diabetes under control.

## 2019-10-25 NOTE — Assessment & Plan Note (Signed)
Urine micro positive for signs of a UTI.  Will obtain a urine culture.  Previous urine culture in January 2021 only significant for multiple species without a specific organism identified.  Will treat with Keflex 250 mg 4 times daily for 7 days.  Patient was given return precautions.  She was also counseled that her hyperglycemia is likely increasing her risk of repeated UTIs.

## 2019-10-25 NOTE — Progress Notes (Signed)
    SUBJECTIVE:   CHIEF COMPLAINT / HPI:    Dysuria Dysuria started this morning along with some pelvic pain Dysuria improved, but she continues to have some pelvic pain with urination Also notes some cloudy urine Feels similar to but not as severe as her previous UTI that became pyelonephritis, which for which she was hospitalized Took AZO today Denies fever, chills, fatigue Is having frequent sex with her boyfriend over the past few days, wonders if this could increase her risk of UTI  Type 2 diabetes Was started on insulin during her hospital stay for pyelonephritis Cannot afford the insulin that was prescribed, which was NPH twice daily and regular insulin TID.  She went to [facility] pharmacy to pick these medications up Has had some difficulties with tolerating swallowing metformin pills in the past Notes that she did feel a lot better when she was taking insulin after her hospitalization. Has thought about trying to have a baby soon and would like to know if that would be okay given her poorly controlled diabetes Received Depo-Provera at the end of January Currently has Medicaid family-planning, but will be able to get insurance through her job at [employer] in June   PERTINENT  PMH / PSH: Poorly controlled type 2 diabetes, recent pyelonephritis  OBJECTIVE:   BP 100/70 Comment: provider informed  Pulse 82   Ht 5\' 2"  (1.575 m)   Wt 207 lb 4 oz (94 kg)   SpO2 99%   BMI 37.91 kg/m   General: well appearing, appears stated age, obese, pleasant Cardiac: RRR, no MRG Respiratory: CTAB, no rhonchi, rales, or wheezing, normal work of breathing Abdomen: Skin: no rashes or other lesions, warm and well perfused Psych: appropriate mood and affect   ASSESSMENT/PLAN:   Acute cystitis with hematuria Urine micro positive for signs of a UTI.  Will obtain a urine culture.  Previous urine culture in January 2021 only significant for multiple species without a specific organism identified.   Will treat with Keflex 250 mg 4 times daily for 7 days.  Patient was given return precautions.  She was also counseled that her hyperglycemia is likely increasing her risk of repeated UTIs.  Type 2 diabetes mellitus with hyperglycemia, without long-term current use of insulin (HCC) Spoke with our pharmacy staff and agreed to start February 2021.  Patient appears motivated to treat her diabetes now.  We will start Tresiba 35 units once daily and increase 1 unit for each day that her fasting blood sugar is greater than 120.  We will communicate in 1 week regarding her insulin and blood sugar levels.  She will see me in 1 month for a repeat hemoglobin A1c.  We will also start metformin 500 mg once daily.  We will slowly titrate up.  Patient will try this again despite her previous reticence regarding taking pills.  Discussed with her that I would not advise that she get pregnant currently since uncontrolled type 2 diabetes can be very dangerous for both her and her fetus.  Encouraged her to use this as motivation to get her diabetes under control.     [place], MD [facility]

## 2019-10-30 ENCOUNTER — Encounter: Payer: Self-pay | Admitting: Family Medicine

## 2019-10-30 LAB — URINE CULTURE

## 2019-11-08 ENCOUNTER — Ambulatory Visit: Payer: Medicaid Other

## 2019-11-12 ENCOUNTER — Other Ambulatory Visit: Payer: Self-pay

## 2019-11-12 ENCOUNTER — Ambulatory Visit (INDEPENDENT_AMBULATORY_CARE_PROVIDER_SITE_OTHER): Payer: Medicaid Other

## 2019-11-12 DIAGNOSIS — Z3042 Encounter for surveillance of injectable contraceptive: Secondary | ICD-10-CM

## 2019-11-12 MED ORDER — MEDROXYPROGESTERONE ACETATE 150 MG/ML IM SUSP
150.0000 mg | Freq: Once | INTRAMUSCULAR | Status: AC
  Administered 2019-11-12: 150 mg via INTRAMUSCULAR

## 2019-11-12 NOTE — Progress Notes (Signed)
Patient here today for Depo Provera injection and is within her dates.    Last contraceptive appt was 08/20/2019  Depo given in RUOQ today.  Site unremarkable & patient tolerated injection.    Next injection due 01/28/20-02/11/2020.  Reminder card given.    Veronda Prude, RN

## 2020-04-29 ENCOUNTER — Ambulatory Visit (INDEPENDENT_AMBULATORY_CARE_PROVIDER_SITE_OTHER): Payer: Medicaid Other | Admitting: Family Medicine

## 2020-04-29 ENCOUNTER — Other Ambulatory Visit: Payer: Self-pay

## 2020-04-29 ENCOUNTER — Other Ambulatory Visit (HOSPITAL_COMMUNITY)
Admission: RE | Admit: 2020-04-29 | Discharge: 2020-04-29 | Disposition: A | Discharge status: Home / Self Care | Payer: Medicaid Other | Source: Ambulatory Visit | Attending: Family Medicine | Admitting: Family Medicine

## 2020-04-29 VITALS — BP 105/80 | HR 83 | Ht 62.0 in | Wt 218.6 lb

## 2020-04-29 DIAGNOSIS — Z113 Encounter for screening for infections with a predominantly sexual mode of transmission: Secondary | ICD-10-CM

## 2020-04-29 DIAGNOSIS — N898 Other specified noninflammatory disorders of vagina: Secondary | ICD-10-CM | POA: Insufficient documentation

## 2020-04-29 DIAGNOSIS — B9689 Other specified bacterial agents as the cause of diseases classified elsewhere: Secondary | ICD-10-CM | POA: Diagnosis not present

## 2020-04-29 DIAGNOSIS — N939 Abnormal uterine and vaginal bleeding, unspecified: Secondary | ICD-10-CM

## 2020-04-29 DIAGNOSIS — N760 Acute vaginitis: Secondary | ICD-10-CM | POA: Diagnosis not present

## 2020-04-29 DIAGNOSIS — E1165 Type 2 diabetes mellitus with hyperglycemia: Secondary | ICD-10-CM

## 2020-04-29 DIAGNOSIS — Z3202 Encounter for pregnancy test, result negative: Secondary | ICD-10-CM

## 2020-04-29 DIAGNOSIS — N926 Irregular menstruation, unspecified: Secondary | ICD-10-CM

## 2020-04-29 LAB — POCT WET PREP (WET MOUNT)
Clue Cells Wet Prep Whiff POC: POSITIVE
Trichomonas Wet Prep HPF POC: ABSENT

## 2020-04-29 LAB — POCT URINE PREGNANCY: Preg Test, Ur: NEGATIVE

## 2020-04-29 MED ORDER — METFORMIN HCL 500 MG PO TABS: 500.0000 mg | ORAL_TABLET | Freq: Every day | ORAL | 2 refills | Status: DC

## 2020-04-29 MED ORDER — METRONIDAZOLE 500 MG PO TABS: 500.0000 mg | ORAL_TABLET | Freq: Two times a day (BID) | ORAL | 0 refills | Status: AC

## 2020-04-29 NOTE — Patient Instructions (Signed)
It was wonderful to see you today.  We checked for any sexually transmitted diseases today, those results should be back in the next several days and I will either call or MyChart you that with those results.   It is very important that you work towards getting your diabetes under better control.  I will send in the medication called Metformin again for you to restart to have better control of your diabetes.  I would like you to follow-up with your primary care provider in the next month to discuss your diabetes.  It is very important that you attempt to get back on Medicaid or can always apply for financial aid/orange card through the [facility] system.  Please make sure you are using condoms when you are sexually active and please consider restarting birth control.  In the meantime, I encourage you to take your prenatal vitamin.

## 2020-04-29 NOTE — Assessment & Plan Note (Signed)
Last A1c 12.1 in 07/2019, not currently on any medication.  Adamantly discussed the importance of follow-up and getting her diabetes under control.  Patient declined obtaining A1c level today due to lack of insurance.  She is willing to restart Metformin especially as this should be a low cost, sent in Metformin 500 mg daily to increase BID if tolerating after one week.  Should follow-up with PCP in the next several weeks for diabetic management.

## 2020-04-29 NOTE — Assessment & Plan Note (Addendum)
Obtained HIV, hep C, RPR, gonorrhea/chlamydia, and wet prep today.  Will follow up results and treat accordingly if necessary.  Encouraged use of barrier protection every time and consider restarting contraception as soon as possible.  Should take PNV.

## 2020-04-29 NOTE — Progress Notes (Signed)
.     SUBJECTIVE:   CHIEF COMPLAINT / HPI: STD screening/vaginal discharge  Shelley Payne is a 24 year old female presenting for evaluation of vaginal discharge.  She reports recent change in vaginal discharge color, now yellow.  Does have some odor associated, denies any itchiness or irritation.  Recently ended a relationship with a female partner and would like to be checked for STDs as he was unfaithful during the relationship. She has had this discharge before and consistent with previous yeast infections.  Denies any abdominal pain, dysuria, dyspareunia, fever, back pain. Did not use condoms, not on any contraception due to no insurance.  She is waiting for her Medicaid family-planning to kick back in to get started back on her previous birth control.  Reports her last menstrual cycle was about 1.5 months ago, did not have her cycle as usual last month, would be due to get her cycle today.  States her cycles are usually monthly.  She also has not followed up for her diabetes due to not having any coverage.  She is not currently on any medications, previously on Metformin and Tresiba.  Checks her glucose intermittently, "can tell when its high," has not seen a number higher than 280.  PERTINENT  PMH / PSH: Type 2 diabetes, hypothyroidism, vitamin D deficiency, hypertension  OBJECTIVE:   BP 105/80   Pulse 83   Ht 5\' 2"  (1.575 m)   Wt 218 lb 9.6 oz (99.2 kg)   BMI 39.98 kg/m   General: Alert, NAD HEENT: NCAT, MMM  Lungs: No increased WOB  Abdomen: soft, non-tender Pelvic exam: VULVA: normal appearing vulva with no masses, tenderness or lesions, VAGINA: vaginal discharge - white and creamy, CERVIX: normal appearing cervix without discharge or lesions, no cervical motion tenderness.  Exam chaperoned by CMA. Ext: Warm, dry  ASSESSMENT/PLAN:   Encounter for screening examination for sexually transmitted disease Obtained HIV, hep C, RPR, gonorrhea/chlamydia, and wet prep today.  Will follow up  results and treat accordingly if necessary.  Encouraged use of barrier protection every time and consider restarting contraception as soon as possible.  Should take PNV.  Vaginal discharge Wet prep and clinical exam consistent with bacterial vaginosis.  Rx'd Flagyl BID x 7 days.   Type 2 diabetes mellitus with hyperglycemia, without long-term current use of insulin (HCC) Last A1c 12.1 in 07/2019, not currently on any medication.  Adamantly discussed the importance of follow-up and getting her diabetes under control.  Patient declined obtaining A1c level today due to lack of insurance.  She is willing to restart Metformin especially as this should be a low cost, sent in Metformin 500 mg daily to increase BID if tolerating after one week.  Should follow-up with PCP in the next several weeks for diabetic management.  Abnormal uterine bleeding Missed most recent menstrual cycle in the setting of no contraception use, however does appear to have a history of irregular menstrual cycles through chart review.  Urine pregnancy test negative today.  Encouraged condom use moving forward and repeat UPT in 2-3 weeks if does not have normal menstrual cycle.     Follow-up in 2-4 weeks with PCP for diabetic management or sooner if needed.  08/2019, DO [facility]

## 2020-04-29 NOTE — Assessment & Plan Note (Addendum)
Wet prep and clinical exam consistent with bacterial vaginosis.  Rx'd Flagyl BID x 7 days.

## 2020-04-29 NOTE — Assessment & Plan Note (Addendum)
Missed most recent menstrual cycle in the setting of no contraception use, however does appear to have a history of irregular menstrual cycles through chart review.  Urine pregnancy test negative today.  Encouraged condom use moving forward and repeat UPT in 2-3 weeks if does not have normal menstrual cycle.

## 2020-04-30 LAB — CERVICOVAGINAL ANCILLARY ONLY
Chlamydia: NEGATIVE
Comment: NEGATIVE
Comment: NEGATIVE
Comment: NORMAL
Neisseria Gonorrhea: NEGATIVE
Trichomonas: NEGATIVE

## 2020-04-30 LAB — HCV INTERPRETATION

## 2020-04-30 LAB — RPR: RPR Ser Ql: NONREACTIVE

## 2020-04-30 LAB — HIV ANTIBODY (ROUTINE TESTING W REFLEX): HIV Screen 4th Generation wRfx: NONREACTIVE

## 2020-04-30 LAB — HCV AB W REFLEX TO QUANT PCR: HCV Ab: <0.1 s/co ratio (ref 0.0–0.9)

## 2020-07-25 ENCOUNTER — Emergency Department (HOSPITAL_COMMUNITY)
Admission: EM | Admit: 2020-07-25 | Discharge: 2020-07-25 | Disposition: A | Discharge status: Home / Self Care | Payer: Self-pay | Attending: Emergency Medicine | Admitting: Emergency Medicine

## 2020-07-25 ENCOUNTER — Encounter (HOSPITAL_COMMUNITY): Payer: Self-pay

## 2020-07-25 ENCOUNTER — Other Ambulatory Visit: Payer: Self-pay

## 2020-07-25 DIAGNOSIS — S060X1A Concussion with loss of consciousness of 30 minutes or less, initial encounter: Secondary | ICD-10-CM | POA: Insufficient documentation

## 2020-07-25 DIAGNOSIS — Y9389 Activity, other specified: Secondary | ICD-10-CM | POA: Insufficient documentation

## 2020-07-25 DIAGNOSIS — W01198A Fall on same level from slipping, tripping and stumbling with subsequent striking against other object, initial encounter: Secondary | ICD-10-CM | POA: Insufficient documentation

## 2020-07-25 DIAGNOSIS — Z5321 Procedure and treatment not carried out due to patient leaving prior to being seen by health care provider: Secondary | ICD-10-CM | POA: Insufficient documentation

## 2020-07-25 LAB — CBC
HCT: 42.8 % (ref 36.0–46.0)
Hemoglobin: 13.8 g/dL (ref 12.0–15.0)
MCH: 26.3 pg (ref 26.0–34.0)
MCHC: 32.2 g/dL (ref 30.0–36.0)
MCV: 81.5 fL (ref 80.0–100.0)
Platelets: 230 10*3/uL (ref 150–400)
RBC: 5.25 MIL/uL — ABNORMAL HIGH (ref 3.87–5.11)
RDW: 13.2 % (ref 11.5–15.5)
WBC: 7.3 10*3/uL (ref 4.0–10.5)
nRBC: 0 % (ref 0.0–0.2)

## 2020-07-25 LAB — BASIC METABOLIC PANEL
Anion gap: 13 (ref 5–15)
BUN: 9 mg/dL (ref 6–20)
CO2: 22 mmol/L (ref 22–32)
Calcium: 9.4 mg/dL (ref 8.9–10.3)
Chloride: 97 mmol/L — ABNORMAL LOW (ref 98–111)
Creatinine, Ser: 0.72 mg/dL (ref 0.44–1.00)
GFR, Estimated: >60 mL/min (ref >60)
Glucose, Bld: 284 mg/dL — ABNORMAL HIGH (ref 70–99)
Potassium: 4.1 mmol/L (ref 3.5–5.1)
Sodium: 132 mmol/L — ABNORMAL LOW (ref 135–145)

## 2020-07-25 LAB — I-STAT BETA HCG BLOOD, ED (MC, WL, AP ONLY): I-stat hCG, quantitative: <5 m[IU]/mL (ref <5)

## 2020-07-25 LAB — CBG MONITORING, ED: Glucose-Capillary: 279 mg/dL — ABNORMAL HIGH (ref 70–99)

## 2020-07-25 NOTE — ED Notes (Signed)
Patient requested to be taken to the lobby.

## 2020-07-25 NOTE — ED Notes (Signed)
Patient requested IV from EMS be removed and stated she was leaving.

## 2020-07-25 NOTE — ED Triage Notes (Signed)
Per EMS- Patient states she went to sit down in her chair at work, became dizzy and fell, hitting her head on the floor. Patient states she had LOC x 15 minutes. It was an unwitnessed fall.  patient c/o headache, dizziness, and nausea.  Patient alert and oriented x 4.  [id number].

## 2020-09-29 ENCOUNTER — Ambulatory Visit (INDEPENDENT_AMBULATORY_CARE_PROVIDER_SITE_OTHER): Payer: Self-pay | Admitting: Student in an Organized Health Care Education/Training Program

## 2020-09-29 ENCOUNTER — Encounter: Payer: Self-pay | Admitting: Student in an Organized Health Care Education/Training Program

## 2020-09-29 ENCOUNTER — Other Ambulatory Visit: Payer: Self-pay

## 2020-09-29 VITALS — BP 112/82 | HR 84 | Wt 222.0 lb

## 2020-09-29 DIAGNOSIS — E063 Autoimmune thyroiditis: Secondary | ICD-10-CM

## 2020-09-29 DIAGNOSIS — E1165 Type 2 diabetes mellitus with hyperglycemia: Secondary | ICD-10-CM

## 2020-09-29 DIAGNOSIS — Z319 Encounter for procreative management, unspecified: Secondary | ICD-10-CM

## 2020-09-29 DIAGNOSIS — Z3169 Encounter for other general counseling and advice on procreation: Secondary | ICD-10-CM

## 2020-09-29 LAB — POCT GLYCOSYLATED HEMOGLOBIN (HGB A1C): Hemoglobin A1C: 10.3 % — AB (ref 4.0–5.6)

## 2020-09-29 LAB — POCT URINE PREGNANCY: Preg Test, Ur: NEGATIVE

## 2020-09-29 MED ORDER — PRENATAL VITAMIN 27-0.8 MG PO TABS: 1.0000 | ORAL_TABLET | Freq: Every day | ORAL | 3 refills | Status: DC

## 2020-09-29 NOTE — Progress Notes (Unsigned)
   SUBJECTIVE:   CHIEF COMPLAINT / HPI: Fertility problems  25 year old female with past medical history for poorly controlled type II diabetes, hypothyroidism, asthma, history of irregular menstrual cycle and hirsutism presents for discussion of fertility problems.  She is desiring a pregnancy currently. She has never been pregnant before.  Her partner has fathered a child previously. So far, the patient and her partner have been actively trying to get pregnant since August.  They are not using any form of birth control or protection since her Depo shot expired in July 2021.  Patient endorses having monthly menstrual cycles which are 2 to 4 weeks apart typically. Patient endorses having intercourse with her partner about twice every few weeks or sometimes more.  She has not been tracking her cycle or monitoring for ovulation. Last menstrual cycle was February 1  H/o of chlamydia once, trichomonis once, and BV.  Had test of clearance after those infections.  No family history of fertility issues. Does not take prenatal vitamin.   OBJECTIVE:   BP 112/82   Pulse 84   Wt 222 lb (100.7 kg)   SpO2 99%   BMI 40.60 kg/m   Physical Exam Constitutional:      Appearance: She is obese. She is not ill-appearing.  HENT:     Head: Normocephalic.  Cardiovascular:     Rate and Rhythm: Normal rate.  Pulmonary:     Effort: Pulmonary effort is normal.  Abdominal:     Palpations: Abdomen is soft.  Neurological:     General: No focal deficit present.     Mental Status: She is alert.  Psychiatric:        Mood and Affect: Mood normal.    ASSESSMENT/PLAN:   Diabetes type 2, uncontrolled Hemoglobin A1c 10.3 today which is improved from last check at 12.1 Patient taking Metformin regularly  Hypothyroidism, acquired, autoimmune TSH last checked >1 year ago and was elevated. Does not appear that patient is taking treatment currently Repeat ordered today  Patient desires pregnancy Patient  desires pregnancy She has poorly controlled diabetes and hypothyroidism which would ideally be addressed prior to pregnancy and would improve her chances of successful pregnancy Discussed recommendation to patient and she desires to proceed with attempting pregnancy patient partner has fathered children and she has not been pregnant before.  Based on history, they have not met criteria for infertility as they have been trying for less than 6 months and have not been having adequate attempts in that time (frequency and timing with ovulation). - spent considerable amount of time discussing benefit and indication of folic acid supplementation before and in early pregnancy. Prescribed prenatal - recommended tracking ovulation with app and purchasing ovulation tests and trying to cluster their attempts when she is ovulating. - it has been >28 days since LMP so ordered upreg today which was negative - follow up in 3 months of adequate attempts to follow up     [place]

## 2020-09-29 NOTE — Patient Instructions (Signed)
It was a pleasure to see you today!  To summarize our discussion for this visit:  Pregnancy is a large undertaking for the body. One thing to consider is making sure that you optimize your health prior to getting pregnant such as good diabetes control and weight loss.   If you would like to move forward now with pregnancy, please take a prenatal vitamin daily to help with the development of your baby's brain  Also, to help you be successful, please track your ovulation cycle. There are apps that you can try and can also get testing kits from the pharmacy to test your ovulation. During your ovulatory times, increase your attempts of getting pregnant.   We will do a urine pregnancy test today to see if you are currently pregnant  Some additional health maintenance measures we should update are: Health Maintenance Due  Topic Date Due  . PNEUMOCOCCAL POLYSACCHARIDE VACCINE AGE 58-64 HIGH RISK  Never done  . COVID-19 Vaccine (1) Never done  . OPHTHALMOLOGY EXAM  Never done  . HPV VACCINES (1 - 2-dose series) Never done  . TETANUS/TDAP  Never done  . FOOT EXAM  11/13/2015  . URINE MICROALBUMIN  11/13/2015  . INFLUENZA VACCINE  02/24/2020  .   Please return to our clinic to see [place] again in 3 months.  Call the clinic at [phone] if your symptoms worsen or you have any concerns.   Thank you for allowing me to take part in your care,  Dr. Jamelle Rushing   Preventing Birth Defects During Pregnancy, Adult Birth defects, also called congenital abnormalities, occur when part of a baby's body does not grow or develop correctly during pregnancy. Birth defects can develop at any time in the pregnancy, but they most commonly develop in the first 3 months of pregnancy when the baby's organs, muscles, and bones are forming. Birth defects are common and can affect up to 1 in 41 babies born in the [place]. Birth defects can affect any part of the baby's body. A birth defect can be discovered  before birth during pregnancy care (prenatalcare), at birth, or within the baby's first year of life. The cause of most birth defects is not known. However, for some birth defects, like fetal alcohol syndrome, the cause is known. How can birth defects affect my baby? Birth defects can cause problems in overall health, how the body develops, or how the body works. They may range from mild to serious conditions. They may be visible, such as a cleft lip or clubbed foot. Others cannot be seen easily and are discovered after medical exams or tests. Some birth defects have little impact on your baby as he or she grows. Others can lead to more severe, lifelong (chronic) disabilities. Birth defects can affect your baby physically, mentally, and emotionally, and can impact learning abilities. What can increase my baby's risk for birth defects? The following factors increase your risk for having a baby with a birth defect:  Using tobacco products, drinking alcohol, or using drugs during pregnancy.  Having certain medical conditions, such as being obese or having uncontrolled diabetes before and during pregnancy.  Taking certain medicines.  Having someone in your family who has a birth defect.  Being an older mother.  Having long-term exposure to certain chemicals, pollutants, or environmental hazards known to cause birth defects.  Having certain infections during pregnancy that are known to cause birth defects. What actions can I take to lower my risk? If you are  pregnant, think you might be pregnant, or are planning to become pregnant, take the following steps to lower your baby's risk of a birth defect: Eat a healthy diet  Take prenatal vitamins and eat foods that contain folic acid. Get at least 400 mcg of folic acid daily.  Eat a variety of healthy foods, such as: ? Whole-grain breads and cereals that are fortified with folic acid. ? Fresh fruits and vegetables. ? Well-cooked lean meats such  as chicken or pork, and fish that is low in mercury.  Do not eat or drink unpasteurized dairy products, including soft cheeses.   Live a healthy lifestyle  Do not drink alcohol or use drugs.  Do not use any products that contain nicotine or tobacco, such as cigarettes, e-cigarettes, and chewing tobacco. If you need help quitting, ask your health care provider.  Maintain a healthy weight. Manage your weight gain during pregnancy.   Protect yourself from infections  Wash your hands with soap and water after using the bathroom. If soap and water are not available, use hand sanitizer.  Wear insect repellent. Ask your health care provider or pharmacist which insect repellents are safe to use during pregnancy.  Avoid cat litter. Cats can spread an infection (toxoplasmosis) through their feces.  Stay up to date on your vaccinations.  Learn how to protect yourself against certain infections during pregnancy, such as rubella, toxoplasmosis, Zika, cytomegalovirus, listeria, and STIs (sexually transmitted infections).   Talk with your health care provider  Get prenatal care as soon as you think you are pregnant.  Work with your health care provider to manage any conditions you have, such as diabetes.  Talk with your health care provider about whether your medicines or supplements are safe to take during pregnancy. Where to find support For more support:  Talk with your health care provider.  If your baby is born with a birth defect, consider joining a support group for families living with birth defects. Where to find more information Visit the following websites to learn more about preventing birth defects.  March of Dimes: www.marchofdimes.org  Centers for Disease Control and Prevention: [employer] Summary  During pregnancy, you can take actions to prevent birth defects and have a healthy pregnancy. See a health care provider and start prenatal care as soon as you know you are  pregnant.  Most birth defects do not have a known cause.  Eat a healthy diet and get at least 400 mcg of folic acid a day.  Learn how to protect yourself against certain infections during pregnancy that increase the risk of birth defects.  Talk with your health care provider about how you can decrease your risk of birth defects during pregnancy. This information is not intended to replace advice given to you by your health care provider. Make sure you discuss any questions you have with your health care provider. Document Revised: 06/28/2019 Document Reviewed: 06/28/2019 Elsevier Patient Education  2021 [employer].

## 2020-09-30 ENCOUNTER — Encounter: Payer: Self-pay | Admitting: Student in an Organized Health Care Education/Training Program

## 2020-09-30 DIAGNOSIS — Z319 Encounter for procreative management, unspecified: Secondary | ICD-10-CM | POA: Insufficient documentation

## 2020-09-30 HISTORY — DX: Encounter for procreative management, unspecified: Z31.9

## 2020-09-30 NOTE — Assessment & Plan Note (Signed)
TSH last checked >1 year ago and was elevated. Does not appear that patient is taking treatment currently Repeat ordered today

## 2020-09-30 NOTE — Assessment & Plan Note (Signed)
Hemoglobin A1c 10.3 today which is improved from last check at 12.1 Patient taking Metformin regularly

## 2020-09-30 NOTE — Assessment & Plan Note (Signed)
Patient desires pregnancy She has poorly controlled diabetes and hypothyroidism which would ideally be addressed prior to pregnancy and would improve her chances of successful pregnancy Discussed recommendation to patient and she desires to proceed with attempting pregnancy patient partner has fathered children and she has not been pregnant before.  Based on history, they have not met criteria for infertility as they have been trying for less than 6 months and have not been having adequate attempts in that time (frequency and timing with ovulation). - spent considerable amount of time discussing benefit and indication of folic acid supplementation before and in early pregnancy. Prescribed prenatal - recommended tracking ovulation with app and purchasing ovulation tests and trying to cluster their attempts when she is ovulating. - it has been >28 days since LMP so ordered upreg today which was negative - follow up in 3 months of adequate attempts to follow up

## 2020-11-27 ENCOUNTER — Ambulatory Visit: Payer: Medicaid Other

## 2020-11-27 NOTE — Progress Notes (Deleted)
   Subjective:   Patient ID: Shelley Payne    DOB: 1995-07-31, 25 y.o. female   MRN: [id number]  Shelley Payne is a 25 y.o. female with a history of HTN, T2DM, goiter, hypothyroidism w/ h/o autoimmune thyroiditis, acanthosis nigricans, AUB, hirsutism, AUB, environmental allergies, microalbuminuria, morbid obesity, Vitamin D deficiency here for dysuria  HPI:   Review of Systems:  Per HPI.   Objective:   There were no vitals taken for this visit. Vitals and nursing note reviewed.  General: pleasant ***, sitting comfortably in exam chair, well nourished, well developed, in no acute distress with non-toxic appearance HEENT: normocephalic, atraumatic, moist mucous membranes, oropharynx clear without erythema or exudate, TM normal bilaterally  Neck: supple, non-tender without lymphadenopathy CV: regular rate and rhythm without murmurs, rubs, or gallops, no lower extremity edema, 2+ radial and pedal pulses bilaterally Lungs: clear to auscultation bilaterally with normal work of breathing on room air Resp: breathing comfortably on room air, speaking in full sentences Abdomen: soft, non-tender, non-distended, no masses or organomegaly palpable, normoactive bowel sounds Skin: warm, dry, no rashes or lesions Extremities: warm and well perfused, normal tone MSK: ROM grossly intact, strength intact, gait normal Neuro: Alert and oriented, speech normal  Assessment & Plan:   No problem-specific Assessment & Plan notes found for this encounter.  No orders of the defined types were placed in this encounter.  No orders of the defined types were placed in this encounter.   {    This will disappear when note is signed, click to select method of visit    :1}  Orpah Cobb, DO PGY-3, [facility] 11/27/2020 7:54 AM

## 2021-03-05 ENCOUNTER — Other Ambulatory Visit: Payer: Self-pay

## 2021-03-05 ENCOUNTER — Other Ambulatory Visit (HOSPITAL_COMMUNITY)
Admission: RE | Admit: 2021-03-05 | Discharge: 2021-03-05 | Disposition: A | Discharge status: Home / Self Care | Payer: Managed Care, Other (non HMO) | Source: Ambulatory Visit | Attending: Family Medicine | Admitting: Family Medicine

## 2021-03-05 ENCOUNTER — Ambulatory Visit (INDEPENDENT_AMBULATORY_CARE_PROVIDER_SITE_OTHER): Payer: Medicaid Other | Admitting: Family Medicine

## 2021-03-05 VITALS — BP 106/70 | Ht 62.0 in | Wt 227.1 lb

## 2021-03-05 DIAGNOSIS — N926 Irregular menstruation, unspecified: Secondary | ICD-10-CM

## 2021-03-05 DIAGNOSIS — Z3202 Encounter for pregnancy test, result negative: Secondary | ICD-10-CM | POA: Diagnosis not present

## 2021-03-05 DIAGNOSIS — N898 Other specified noninflammatory disorders of vagina: Secondary | ICD-10-CM | POA: Diagnosis not present

## 2021-03-05 DIAGNOSIS — Z113 Encounter for screening for infections with a predominantly sexual mode of transmission: Secondary | ICD-10-CM | POA: Diagnosis present

## 2021-03-05 LAB — POCT WET PREP (WET MOUNT)
Clue Cells Wet Prep Whiff POC: NEGATIVE
Trichomonas Wet Prep HPF POC: ABSENT

## 2021-03-05 LAB — POCT URINE PREGNANCY: Preg Test, Ur: NEGATIVE

## 2021-03-05 NOTE — Patient Instructions (Addendum)
It was nice to meet you today.  Thank you for coming in.  Your pregnancy test today was negative.  Your testing for Bacterial Vaginosis, Trichomonas and yeast infection were negative.  Please call back if you have any worsening symptoms.  We also tested you for STD's today.  I will message you in my chart with results and prescribe any medication needed for treatment.  Be Well, Dr Pecola Leisure

## 2021-03-05 NOTE — Progress Notes (Signed)
uriwet

## 2021-03-06 LAB — CERVICOVAGINAL ANCILLARY ONLY
Chlamydia: NEGATIVE
Comment: NEGATIVE
Comment: NORMAL
Neisseria Gonorrhea: NEGATIVE

## 2021-03-06 LAB — RPR: RPR Ser Ql: NONREACTIVE

## 2021-03-06 LAB — HIV ANTIBODY (ROUTINE TESTING W REFLEX): HIV Screen 4th Generation wRfx: NONREACTIVE

## 2021-03-06 NOTE — Assessment & Plan Note (Signed)
Vaginal discharge.  Denies other symptoms.  Mild amount of white vaginal discharge observed on speculum exam, otherwise normal.  UPT negative. - Obtain wet prep - Obtain testing for GC/Chlamydia, RPR, HIV

## 2021-03-06 NOTE — Progress Notes (Signed)
    SUBJECTIVE:   CHIEF COMPLAINT / HPI: Vaginal Discharge  Patient reports increased vaginal discharge.  Denies foul smell or pelvic pain.  Denies vaginal itch.  Denies any dysuria, changes in urination or pain.  Indictaes is currently sexually active and behind on period.  Not yet due for repeat pap smear.  Requesting pregnancy test, Wet prep and STI testing.  PERTINENT  PMH / PSH: BV, Yeast infection  OBJECTIVE:   BP 106/70   Ht 5\' 2"  (1.575 m)   Wt 227 lb 2 oz (103 kg)   LMP 02/01/2021   BMI 41.54 kg/m    Physical Exam Exam conducted with a chaperone present.  Constitutional:      General: She is not in acute distress.    Appearance: She is not ill-appearing.  Genitourinary:    Exam position: Lithotomy position.     Pubic Area: No rash.      Comments: Pelvic exam performed with nurse chaperone, mild amount of white discharge, otherwise normal exam Neurological:     Mental Status: She is alert.     ASSESSMENT/PLAN:   Vaginal discharge Vaginal discharge.  Denies other symptoms.  Mild amount of white vaginal discharge observed on speculum exam, otherwise normal.  UPT negative. - Obtain wet prep - Obtain testing for GC/Chlamydia, RPR, HIV     04/04/2021, MD [facility]

## 2021-04-28 IMAGING — CT CT ABD-PELV W/ CM
2 of 4 series · 16 of 46 positions shown, 18 images · IV contrast (omnipaque)
Comparison: CT abdomen pelvis 05/12/2018

CLINICAL DATA: Right lower quadrant and periumbilical abdominal
pain.

EXAM:
CT ABDOMEN AND PELVIS WITH CONTRAST
TECHNIQUE: Multidetector CT imaging of the abdomen and pelvis was performed
using the standard protocol following bolus administration of
intravenous contrast.
CONTRAST:  100mL OMNIPAQUE IOHEXOL 300 MG/ML  SOLN

[Series 2: axial st · axial · 0.73mm/px · z∈[-530,-90]mm · 13 of 98 slices shown, 15 images]
[im 5/98  soft-tissue]
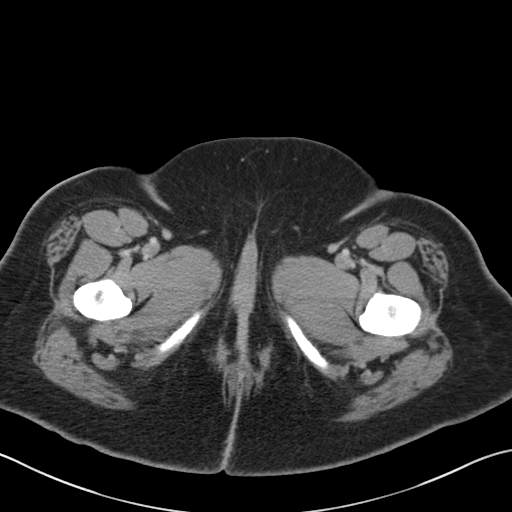
[im 5/98  bone]
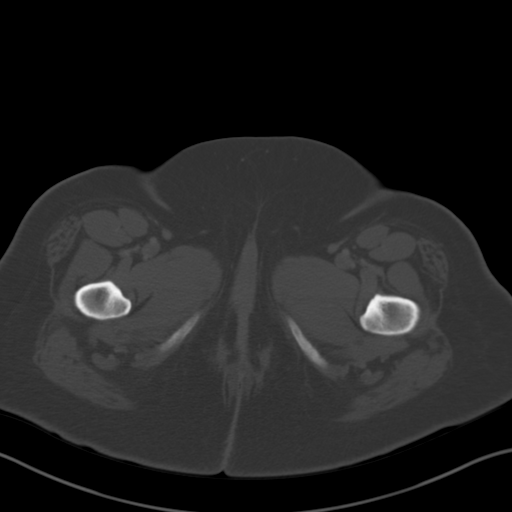
[im 15/98  soft-tissue]
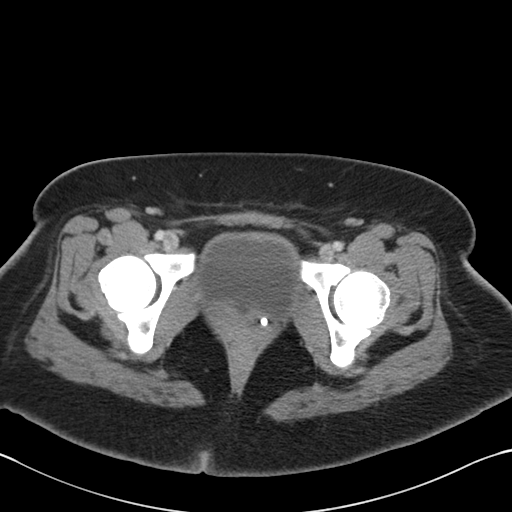
[im 20/98  soft-tissue]
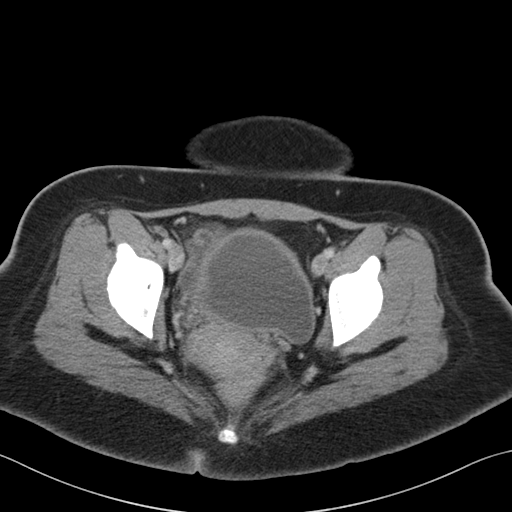
[im 30/98  soft-tissue]
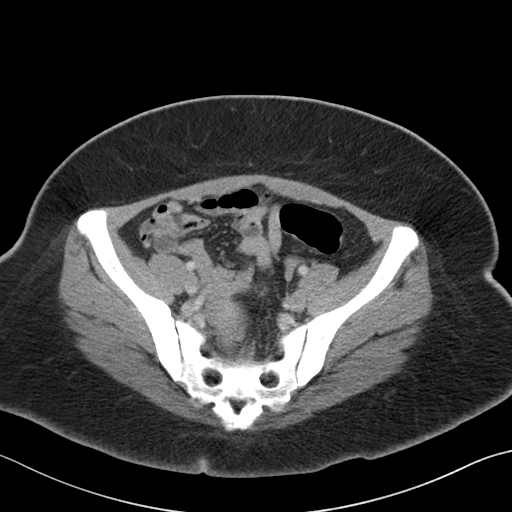
[im 34/98  soft-tissue]
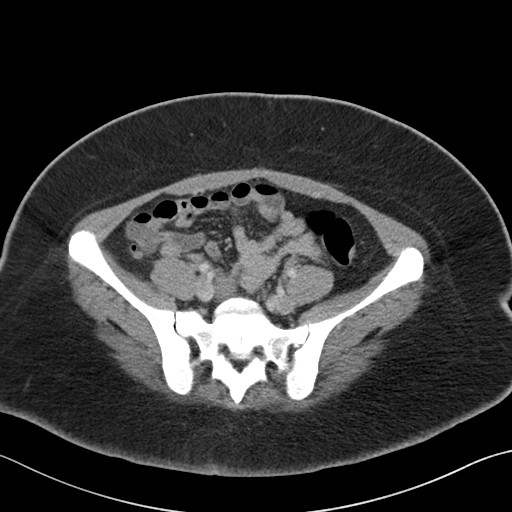
[im 44/98  soft-tissue]
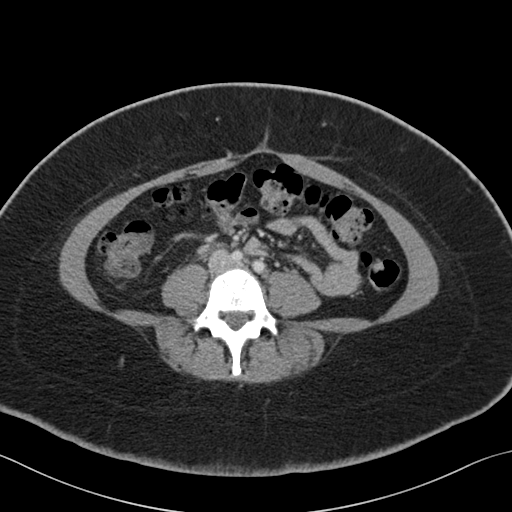
[im 49/98  soft-tissue]
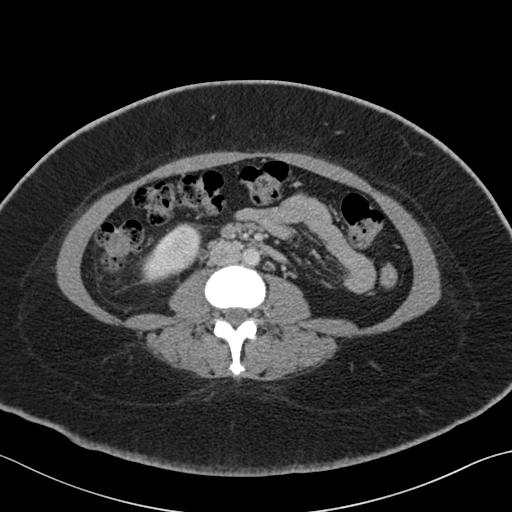
[im 54/98  soft-tissue]
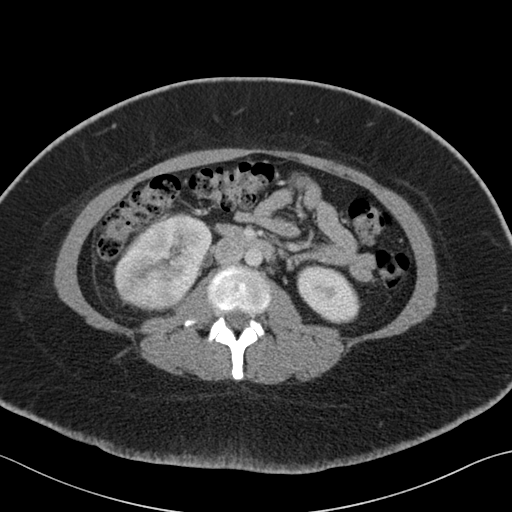
[im 64/98  soft-tissue]
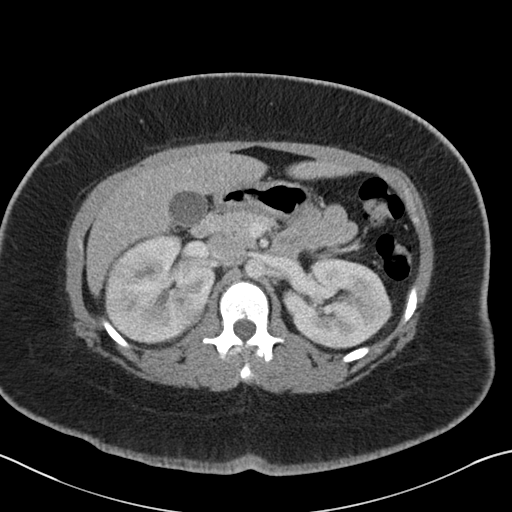
[im 64/98  bone]
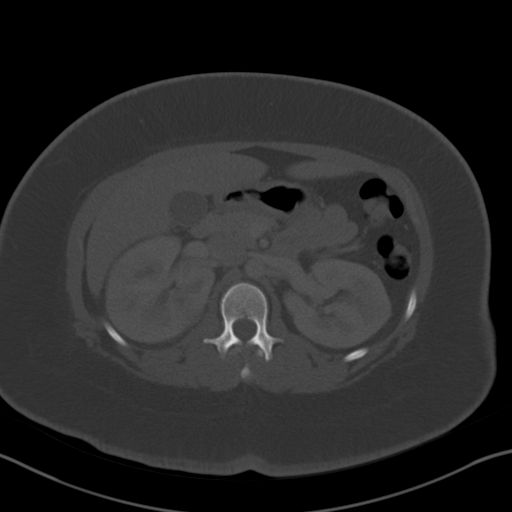
[im 68/98  soft-tissue]
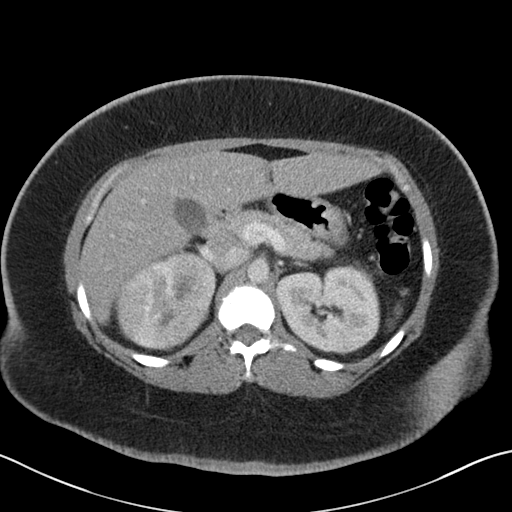
[im 78/98  soft-tissue]
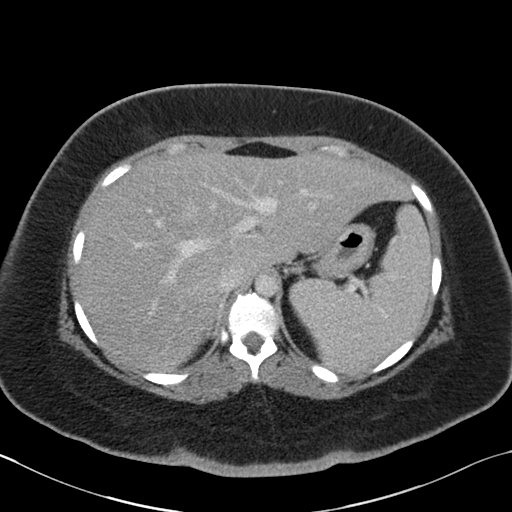
[im 83/98  soft-tissue]
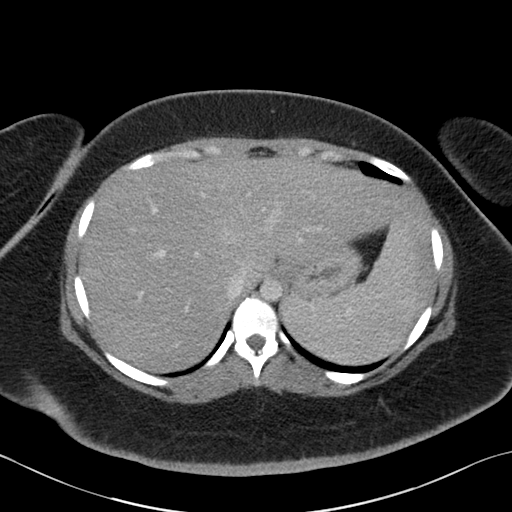
[im 93/98  soft-tissue]
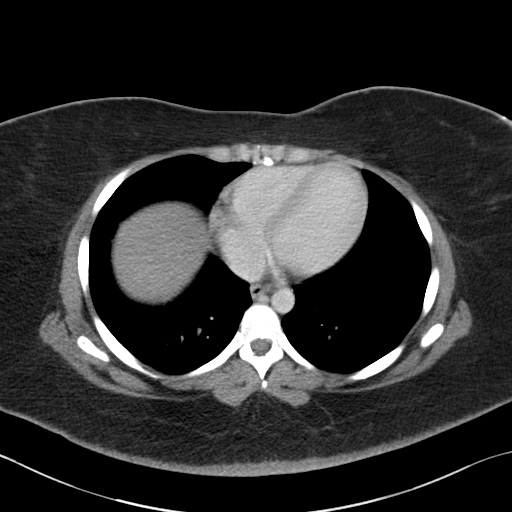

[Series 5: coronal st · coronal · 0.80mm/px · 3 of 143 slices shown]
[im 48/143  soft-tissue]
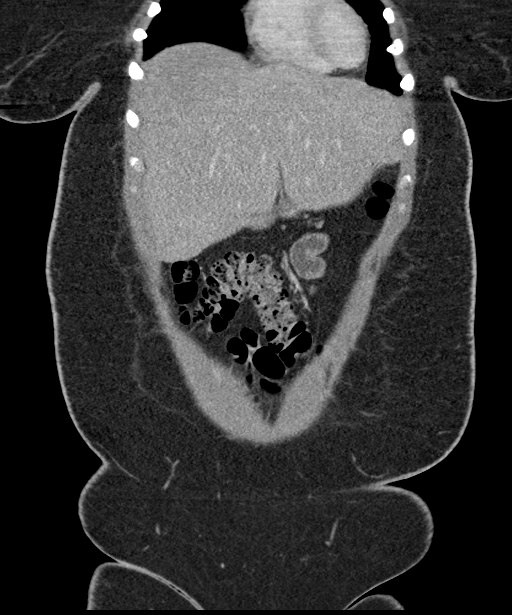
[im 64/143  soft-tissue]
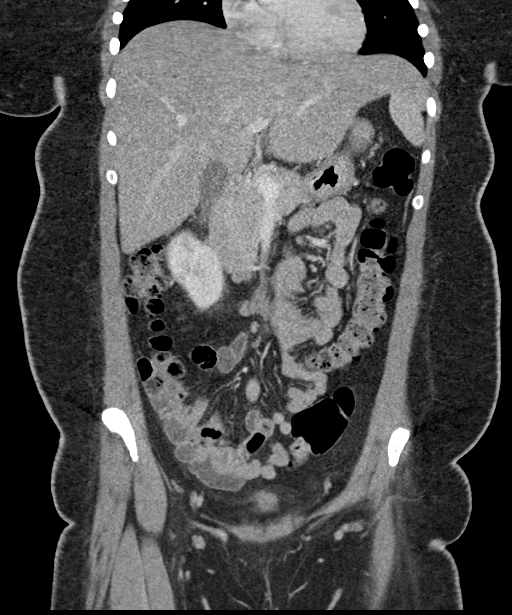
[im 79/143  soft-tissue]
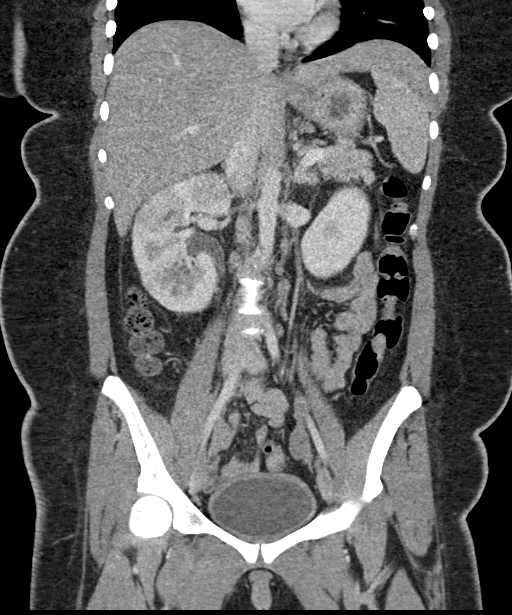

[16 of 46 positions shown; findings below may reference images not displayed]

FINDINGS: Lower chest: Normal heart size. Dependent atelectasis right lower
lobe. No pleural effusion.

Hepatobiliary: The liver is normal in size and contour. Gallbladder
is unremarkable.

Pancreas: Unremarkable

Spleen: Unremarkable

Adrenals/Urinary Tract: Normal adrenal glands. The right kidney is
enlarged. There is patchy hypoenhancement involving the right kidney
with perinephric fat stranding. There is urothelial enhancement
involving the majority of the right ureter. Left kidney is
unremarkable. Urinary bladder is unremarkable.

Stomach/Bowel: No abnormal bowel wall thickening or evidence for
bowel obstruction. No free fluid or free intraperitoneal air. Normal
morphology of the stomach. Normal appendix.

Vascular/Lymphatic: Normal caliber abdominal aorta. No
retroperitoneal lymphadenopathy.

Reproductive: Uterus and adnexal structures are unremarkable.

Other: None.

Musculoskeletal: No aggressive or acute appearing osseous lesions.
IMPRESSION: The right kidney is enlarged with fairly extensive patchy areas of
hypoenhancement. There is a small amount of perinephric fat
stranding. There is hyperenhancement of the urothelium involving the
right ureter. Overall findings are concerning for ascending urinary
tract infection and associated pyelonephritis of the right kidney.

## 2021-07-02 NOTE — Progress Notes (Deleted)
    SUBJECTIVE:   CHIEF COMPLAINT / HPI:   Vaginal Discharge: Patient is a 25 y.o. female presenting with vaginal discharge for *** days.  She states the discharge is of *** consistency.  She endorses *** vaginal odor.  She is interested in screening for sexually transmitted infections today.  Diabetic Follow Up: Patient is a 25 y.o. female who present today for diabetic follow up.   Patient endorses {rwdmsmartlistproblems:24882}  Home medications include: Metformin 500 mg twice daily Patient endorses taking these medications as prescribed.***  Most recent A1Cs:  Lab Results  Component Value Date   HGBA1C 10.3 (A) 09/29/2020   HGBA1C 12.1 (H) 08/26/2019   HGBA1C 12.1 (A) 08/20/2019   Last Microalbumin, LDL, Creatinine: Lab Results  Component Value Date   MICROALBUR 5.8 (H) 11/13/2014   LDLCALC 127 (H) 08/29/2017   CREATININE 0.72 07/25/2020   Patient {rwdoesdoesnot:24881} check blood glucose on a regular basis.  Patient is not up to date on diabetic eye. Patient is not up to date on diabetic foot exam.   PERTINENT  PMH / PSH: ***None relevant  OBJECTIVE:   There were no vitals taken for this visit.   General: NAD, pleasant, able to participate in exam Respiratory: Normal effort, no obvious respiratory distress Pelvic: VULVA: normal appearing vulva with no masses, tenderness or lesions, VAGINA: Normal appearing vagina with normal color, no lesions, with {GYN VAGINAL DISCHARGE:21986} discharge present, ***CERVIX: No lesions, {GYN VAGINAL DISCHARGE:21986} discharge present,  Chaperone *** present for pelvic exam  ASSESSMENT/PLAN:   No problem-specific Assessment & Plan notes found for this encounter.    Assessment:  25 y.o. female with vaginal discharge for***days, as well as***.  Physical exam significant for*** discharge.  Wet prep performed today shows *** consistent with ***.  Patient is interested in STI screening.   Plan: -Wet prep as above.  Will treat  with***. -GC/chlamydia pending -Will check HIV and RPR  A1c today of***.  Previously 10.3.***.  Recommended she get her yearly diabetic eye exam.  We will perform diabetic foot exam at next appointment***  Jackelyn Poling, DO [facility]

## 2021-07-03 ENCOUNTER — Ambulatory Visit: Payer: Medicaid Other

## 2021-08-10 ENCOUNTER — Ambulatory Visit (INDEPENDENT_AMBULATORY_CARE_PROVIDER_SITE_OTHER): Payer: Self-pay | Admitting: Family Medicine

## 2021-08-10 ENCOUNTER — Telehealth: Payer: Self-pay | Admitting: Family Medicine

## 2021-08-10 ENCOUNTER — Encounter: Payer: Self-pay | Admitting: Family Medicine

## 2021-08-10 ENCOUNTER — Other Ambulatory Visit (HOSPITAL_COMMUNITY)
Admission: RE | Admit: 2021-08-10 | Discharge: 2021-08-10 | Disposition: A | Discharge status: Home / Self Care | Payer: 59 | Source: Ambulatory Visit | Attending: Family Medicine | Admitting: Family Medicine

## 2021-08-10 ENCOUNTER — Other Ambulatory Visit: Payer: Self-pay

## 2021-08-10 VITALS — BP 104/62 | HR 103 | Wt 216.2 lb

## 2021-08-10 DIAGNOSIS — Z319 Encounter for procreative management, unspecified: Secondary | ICD-10-CM

## 2021-08-10 DIAGNOSIS — N898 Other specified noninflammatory disorders of vagina: Secondary | ICD-10-CM

## 2021-08-10 DIAGNOSIS — E119 Type 2 diabetes mellitus without complications: Secondary | ICD-10-CM

## 2021-08-10 DIAGNOSIS — E1165 Type 2 diabetes mellitus with hyperglycemia: Secondary | ICD-10-CM

## 2021-08-10 DIAGNOSIS — Z113 Encounter for screening for infections with a predominantly sexual mode of transmission: Secondary | ICD-10-CM

## 2021-08-10 LAB — POCT WET PREP (WET MOUNT)
Clue Cells Wet Prep Whiff POC: NEGATIVE
Trichomonas Wet Prep HPF POC: ABSENT

## 2021-08-10 LAB — POCT GLYCOSYLATED HEMOGLOBIN (HGB A1C): HbA1c, POC (controlled diabetic range): 11.4 % — AB (ref 0.0–7.0)

## 2021-08-10 NOTE — Patient Instructions (Signed)
Thank you for coming to see me today. It was a pleasure. Today we discussed your fertility. I think the reason you are having trouble conceiving is your uncontrolled diabetes. I recommend coming back in to discuss this further.   We performed STD testing today. This will take a few days to come back. If your MyChart is activated, we will message you on there if everything is normal, otherwise we will call. If we need to treat something we will also call you. If you do not hear from [place] in the next 4 days, please give [place] a call.   Please follow-up with me  in 1-2 weeks.  If you have any questions or concerns, please do not hesitate to call the office at [phone].  Best wishes,   Dr Allena Katz

## 2021-08-10 NOTE — Telephone Encounter (Signed)
Called pt but no answer.

## 2021-08-10 NOTE — Progress Notes (Signed)
° ° ° °  SUBJECTIVE:   CHIEF COMPLAINT / HPI:   Shelley Payne is a 26 y.o. female presents for vaginal discharge  Vaginal Discharge Patient reports that vaginal discharge started 1 month. She notes that discharge appears yellow and thick.  She denies vaginal odors.  She denies vaginal pruritis, abnormal vaginal bleeding, dysuria, hematuria, frequency, abdominal pain pelvic pain, nausea, vomiting, fevers or new rash.   History of STIs-Chlamydia.  She is sexually active and  use condoms.  Contraception: none.  LMP 5th Jan.  She does not douche.  Fertility issues Pt has been trying to conceive for a year with partner without luck. Has unprotected sex 4-5 time a week. LMP: 5th Jan. Pt has regular cycles like clockwork and they usually last 5 days. No longer prenatal vitamins as she says they "dont work".  She really wants to get pregnant and is unsure why hasn't been able to yet. Last A1c 10 months ago.  [facility] Office Visit from 08/10/2021 in [place]  PHQ-9 Total Score 3       PERTINENT  PMH / PSH: t2dm, acanthosis nigricans, obesity  OBJECTIVE:   BP 104/62    Pulse (!) 103    Wt 216 lb 3.2 oz (98.1 kg)    LMP 07/26/2021    SpO2 98%    BMI 39.54 kg/m    General: Alert, no acute distress Cardio: Normal S1 and S2, RRR, no r/m/g Pulm: CTAB, normal work of breathing Abdomen: Bowel sounds normal. Abdomen soft and non-tender.  Extremities: No peripheral edema.  Neuro: Cranial nerves grossly intact   Pelvic Exam chaperoned by CMA Sharrie         External: normal female genitalia without lesions or masses        Vagina: normal without lesions or masses        Cervix: normal without lesions or masses        Pap smear: performed        Samples for Wet prep, GC/Chlamydia obtained   ASSESSMENT/PLAN:   Type 2 diabetes mellitus with hyperglycemia, without long-term current use of insulin (HCC) A1c 11.4 Uncontrolled diabetes. Due to time constraints unable to  speak about diabetes management in detail today. Pt will book a follow up ASAP for diabetic education and management. Explained risks of uncontrolled diabetes. Strict ER precautions provided.  Encounter for screening examination for sexually transmitted disease STD and wet prep swabs obtained.  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 Likely 2/2 uncontrolled diabetes and obesity. Pt has very poor understanding of disease process of diabetes and its management. Explained that in order for her to conceive her blood sugar control needs to be much better. Uncontrolled T2DM also comes with its own risks during pregnancy. Recommended weight loss too and that she starts taking pre-natal vitamins again.    Lattie Haw, MD PGY-3 [place]

## 2021-08-11 LAB — CERVICOVAGINAL ANCILLARY ONLY
Bacterial Vaginitis (gardnerella): POSITIVE — AB
Chlamydia: NEGATIVE
Comment: NEGATIVE
Comment: NEGATIVE
Comment: NEGATIVE
Comment: NORMAL
Neisseria Gonorrhea: NEGATIVE
Trichomonas: NEGATIVE

## 2021-08-11 NOTE — Assessment & Plan Note (Signed)
A1c 11.4 Uncontrolled diabetes. Due to time constraints unable to speak about diabetes management in detail today. Pt will book a follow up ASAP for diabetic education and management. Explained risks of uncontrolled diabetes. Strict ER precautions provided.

## 2021-08-11 NOTE — Assessment & Plan Note (Signed)
STD and wet prep swabs obtained.

## 2021-08-11 NOTE — Assessment & Plan Note (Signed)
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 Likely 2/2 uncontrolled diabetes and obesity. Pt has very poor understanding of disease process of diabetes and its management. Explained that in order for her to conceive her blood sugar control needs to be much better. Uncontrolled T2DM also comes with its own risks during pregnancy. Recommended weight loss too and that she starts taking pre-natal vitamins again.

## 2021-08-13 ENCOUNTER — Telehealth: Payer: Self-pay | Admitting: *Deleted

## 2021-08-13 NOTE — Telephone Encounter (Signed)
-----   Message from Lattie Haw, MD sent at 08/12/2021  4:16 PM EST ----- Regarding: diabetic follow up Hi team :)  Please could you call this pt and get her to book follow up asap about diabetes management. Her A1c is very high.  Thank you! [place]

## 2021-08-13 NOTE — Telephone Encounter (Signed)
LVM for pt to call office.  If she calls back please assist her in getting an appointment scheduled.Will also send MyCHart message as well. Shakura Cowing Zimmerman Rumple, CMA

## 2021-08-14 ENCOUNTER — Other Ambulatory Visit: Payer: Self-pay | Admitting: Family Medicine

## 2021-08-14 ENCOUNTER — Encounter: Payer: Self-pay | Admitting: Family Medicine

## 2021-08-14 MED ORDER — FLUCONAZOLE 150 MG PO TABS: 150.0000 mg | ORAL_TABLET | Freq: Once | ORAL | 0 refills | Status: AC

## 2021-12-08 ENCOUNTER — Ambulatory Visit (INDEPENDENT_AMBULATORY_CARE_PROVIDER_SITE_OTHER): Payer: 59 | Admitting: Family Medicine

## 2021-12-08 ENCOUNTER — Other Ambulatory Visit (HOSPITAL_COMMUNITY): Payer: Self-pay

## 2021-12-08 ENCOUNTER — Other Ambulatory Visit (HOSPITAL_COMMUNITY)
Admission: RE | Admit: 2021-12-08 | Discharge: 2021-12-08 | Disposition: A | Discharge status: Home / Self Care | Payer: Managed Care, Other (non HMO) | Source: Ambulatory Visit | Attending: Family Medicine | Admitting: Family Medicine

## 2021-12-08 VITALS — BP 105/60 | HR 74 | Temp 98.6°F | Ht 62.0 in | Wt 216.0 lb

## 2021-12-08 DIAGNOSIS — N898 Other specified noninflammatory disorders of vagina: Secondary | ICD-10-CM

## 2021-12-08 DIAGNOSIS — Z1159 Encounter for screening for other viral diseases: Secondary | ICD-10-CM

## 2021-12-08 DIAGNOSIS — E1165 Type 2 diabetes mellitus with hyperglycemia: Secondary | ICD-10-CM

## 2021-12-08 DIAGNOSIS — Z113 Encounter for screening for infections with a predominantly sexual mode of transmission: Secondary | ICD-10-CM | POA: Insufficient documentation

## 2021-12-08 DIAGNOSIS — Z114 Encounter for screening for human immunodeficiency virus [HIV]: Secondary | ICD-10-CM

## 2021-12-08 DIAGNOSIS — R8290 Unspecified abnormal findings in urine: Secondary | ICD-10-CM

## 2021-12-08 LAB — POCT WET PREP (WET MOUNT)
Clue Cells Wet Prep Whiff POC: NEGATIVE
Trichomonas Wet Prep HPF POC: ABSENT

## 2021-12-08 LAB — POCT URINALYSIS DIP (MANUAL ENTRY)
Bilirubin, UA: NEGATIVE
Glucose, UA: NEGATIVE mg/dL
Ketones, POC UA: NEGATIVE mg/dL
Nitrite, UA: NEGATIVE
Protein Ur, POC: NEGATIVE mg/dL
Spec Grav, UA: 1.015 (ref 1.010–1.025)
Urobilinogen, UA: 0.2 E.U./dL
pH, UA: 5.5 (ref 5.0–8.0)

## 2021-12-08 LAB — POCT URINE PREGNANCY: Preg Test, Ur: NEGATIVE

## 2021-12-08 LAB — POCT GLYCOSYLATED HEMOGLOBIN (HGB A1C): HbA1c, POC (controlled diabetic range): 8.9 % — AB (ref 0.0–7.0)

## 2021-12-08 MED ORDER — METFORMIN HCL ER 500 MG PO TB24: 1000.0000 mg | ORAL_TABLET | Freq: Two times a day (BID) | ORAL | 3 refills | Status: DC

## 2021-12-08 MED ORDER — ACCU-CHEK MULTICLIX LANCETS MISC: 0 refills | Status: DC

## 2021-12-08 MED ORDER — FLUCONAZOLE 150 MG PO TABS: 150.0000 mg | ORAL_TABLET | Freq: Once | ORAL | 0 refills | Status: AC

## 2021-12-08 MED ORDER — BLOOD GLUCOSE MONITOR KIT: PACK | 0 refills | Status: AC

## 2021-12-08 NOTE — Addendum Note (Signed)
Addended by: Jennette Bill on: 12/08/2021 11:50 AM ? ? Modules accepted: Orders ? ?

## 2021-12-08 NOTE — Assessment & Plan Note (Signed)
Patient complaining of vaginal discharge today.  Also reports vaginal irritation.  Wet prep showing yeast.  UPT negative.  Have collected other STDs screenings.  Prescribed Diflucan x1.  Will call patient with results from GC/committee as well as RPR and HIV.  Urinalysis and urine culture also collected. ?

## 2021-12-08 NOTE — Patient Instructions (Signed)
It was wonderful seeing you today.  Regarding your vaginal discharge you do have a yeast infection.  I sent a prescription for Diflucan to your pharmacy which she will take 1 tablet and that should help with the yeast infection.  Regarding your diabetes your hemoglobin A1c was 8.9 today which is a good improvement from your previous check.  I would like for you to start taking the metformin daily and you will start by taking 1 pill every morning and after 1 week increase to 1 pill in the morning and 1 pill at night.  After a week of doing 1 pill twice a day I want you to increase to 2 pills in the morning and 1 at night and then after 1 more week I would like for you to switch to 2 pills in the morning and 2 pills at night.  I would like to see you and 1 month to see how your sugars have been doing so please check your sugars in the morning before you eat breakfast and keep a log of them.  At the next visit we will also check some other lab work.  Regarding the rest of the test ordered today I will call you with those results.  Please let me know if you have any issues or questions. ?

## 2021-12-08 NOTE — Assessment & Plan Note (Signed)
A1c today of 8.9.  Patient has not been taking her metformin as prescribed but has been working on her diet.  Transition to metformin XR 500 mg and plan to titrate her to 1000 mg twice daily.  Following up in 1 month.  Patient may need addition of insulin at that visit depending on what her blood sugars been.  Refilled her blood glucose monitoring supplies and sent the prescription for the metformin.  Follow-up in 1 month. ?

## 2021-12-08 NOTE — Progress Notes (Signed)
? ? ?  SUBJECTIVE:  ? ?CHIEF COMPLAINT / HPI:  ? ?Vaginal discharge ?Patient's main reason for the visit today is abnormal vaginal discharge as well as vaginal irritation.  She has had history of yeast infections and reports she thinks she has another yeast infection.  She would like to be tested for STDs as well today.  Patient reports that LMP was 5/7 but was extremely light and she only bled for 2-1/2 days.  She is not currently on birth control and thought that she cannot get pregnant because of her uncontrolled diabetes. ? ? ?Type 2 diabetes ?Patient reports that since last being seen she is not followed up due to financial issues.  She started a new job and did not have time to take off to come here as well.  She is also not been taking her insulin or metformin as prescribed.  Reports she has not been taking her blood sugars.  Denies any polyuria or polydipsia.  Reports that she does not wake up in the night to go to to the bathroom. ? ? ?OBJECTIVE:  ? ?BP 105/60   Pulse 74   Temp 98.6 ?F (37 ?C)   Ht 5\' 2"  (1.575 m)   Wt 216 lb (98 kg)   LMP 11/29/2021   SpO2 99%   BMI 39.51 kg/m?   ?General: Pleasant 26 year old female, no acute distress ?Cardiac: Regular rate ?Rester: Breathing, speaking full sentences ?Abdomen: Soft, nontender ?MSK: No gross abnormalities ?GU: Normal female external genitalia, moderate amount of white discharge in currently, no friable cervix, no cervical lesions, no cervical motion tenderness ? ? ?ASSESSMENT/PLAN:  ? ?Type 2 diabetes mellitus with hyperglycemia, without long-term current use of insulin ([place]) ?A1c today of 8.9.  Patient has not been taking her metformin as prescribed but has been working on her diet.  Transition to metformin XR 500 mg and plan to titrate her to 1000 mg twice daily.  Following up in 1 month.  Patient may need addition of insulin at that visit depending on what her blood sugars been.  Refilled her blood glucose monitoring supplies and sent the  prescription for the metformin.  Follow-up in 1 month. ? ?Vaginal discharge ?Patient complaining of vaginal discharge today.  Also reports vaginal irritation.  Wet prep showing yeast.  UPT negative.  Have collected other STDs screenings.  Prescribed Diflucan x1.  Will call patient with results from GC/committee as well as RPR and HIV.  Urinalysis and urine culture also collected. ?  ? ? ?Gifford Shave, MD ?[place]  ? ?

## 2021-12-09 LAB — CERVICOVAGINAL ANCILLARY ONLY
Chlamydia: NEGATIVE
Comment: NEGATIVE
Comment: NEGATIVE
Comment: NORMAL
Neisseria Gonorrhea: NEGATIVE
Trichomonas: NEGATIVE

## 2021-12-10 ENCOUNTER — Telehealth (HOSPITAL_BASED_OUTPATIENT_CLINIC_OR_DEPARTMENT_OTHER): Payer: Self-pay | Admitting: Family Medicine

## 2021-12-10 LAB — RPR: RPR Ser Ql: REACTIVE — AB

## 2021-12-10 LAB — HIV ANTIBODY (ROUTINE TESTING W REFLEX): HIV Screen 4th Generation wRfx: NONREACTIVE

## 2021-12-10 LAB — RPR, QUANT+TP ABS (REFLEX)
Rapid Plasma Reagin, Quant: 1:64 {titer} — ABNORMAL HIGH
T Pallidum Abs: REACTIVE — AB

## 2021-12-10 NOTE — Telephone Encounter (Signed)
Called patient and discussed syphilis results.  Discussed that she would need treatment.  Messaged RN team to schedule nurse visit for penicillin treatment.  Discussed safe sex practices and instructed her to inform any partners that they have been exposed to syphilis.  Patient is agreeable.  No further questions at this time.

## 2021-12-10 NOTE — Telephone Encounter (Signed)
Please provide the dosage and schedule of injections and I will call her to get her set up for appt. Christen Bame, CMA

## 2021-12-11 LAB — URINE CULTURE

## 2021-12-11 NOTE — Telephone Encounter (Signed)
Pt called and appt made for Tuesday 12/14/21.  Unable to come before due to work schedule. Jone Baseman, CMA

## 2021-12-15 ENCOUNTER — Ambulatory Visit (INDEPENDENT_AMBULATORY_CARE_PROVIDER_SITE_OTHER): Payer: 59

## 2021-12-15 DIAGNOSIS — A539 Syphilis, unspecified: Secondary | ICD-10-CM | POA: Diagnosis not present

## 2021-12-15 MED ORDER — PENICILLIN G BENZATHINE 1200000 UNIT/2ML IM SUSY
1.2000 10*6.[IU] | PREFILLED_SYRINGE | Freq: Once | INTRAMUSCULAR | Status: AC
  Administered 2021-12-15: 1.2 10*6.[IU] via INTRAMUSCULAR

## 2021-12-15 NOTE — Progress Notes (Signed)
Patient in nurse clinic today for STD treatment of Syphilis.   Patient advised to abstain from sex for 7-10 days after treatment of self and partner.    2.4 million units of penicillin G given to patient.   Patient to follow up in 2-3 months for re-screening.    Provided condoms and advised to use with all sexual activity. Patient verbalized understanding.   STD report form fax completed and faxed to [facility] Department at [phone] (STD department).

## 2021-12-18 ENCOUNTER — Encounter: Payer: Self-pay | Admitting: Family Medicine

## 2021-12-18 ENCOUNTER — Telehealth: Payer: Self-pay

## 2021-12-18 NOTE — Telephone Encounter (Signed)
Patient contacted to discuss mychart message sent this afternoon.   Patient reports possible adverse reaction to Penicillin. Patient was treated for Syphilis on 5/23 with 2.4 million units of Penicillin.   Patient reports by the time she got home she noticed a rash under her arm pit and possible rash "in my private area." Patient also reports having very low energy since treatment and diarrhea.  Patient denies any dizziness, nausea/vomiting, trouble breathing, loss of consciousness or irregular heart beat.   Patient advised to stay well hydrated and to continue to monitor symptoms. If symptoms worsen or new symptoms arise to go to the ED immediately.   Patient scheduled for a FU visit for 5/30.  Patient agreed with plan.

## 2021-12-22 ENCOUNTER — Ambulatory Visit: Payer: 59

## 2021-12-29 ENCOUNTER — Encounter: Payer: Self-pay | Admitting: *Deleted

## 2022-01-13 ENCOUNTER — Ambulatory Visit (INDEPENDENT_AMBULATORY_CARE_PROVIDER_SITE_OTHER): Payer: Managed Care, Other (non HMO) | Admitting: Family Medicine

## 2022-01-13 ENCOUNTER — Encounter: Payer: Self-pay | Admitting: Family Medicine

## 2022-01-13 ENCOUNTER — Other Ambulatory Visit (HOSPITAL_COMMUNITY)
Admission: RE | Admit: 2022-01-13 | Discharge: 2022-01-13 | Disposition: A | Discharge status: Home / Self Care | Payer: Managed Care, Other (non HMO) | Source: Ambulatory Visit | Attending: Family Medicine | Admitting: Family Medicine

## 2022-01-13 VITALS — BP 108/88 | HR 78 | Ht 62.0 in | Wt 215.5 lb

## 2022-01-13 DIAGNOSIS — E1165 Type 2 diabetes mellitus with hyperglycemia: Secondary | ICD-10-CM

## 2022-01-13 DIAGNOSIS — Z113 Encounter for screening for infections with a predominantly sexual mode of transmission: Secondary | ICD-10-CM | POA: Insufficient documentation

## 2022-01-13 DIAGNOSIS — Z1159 Encounter for screening for other viral diseases: Secondary | ICD-10-CM | POA: Diagnosis not present

## 2022-01-13 DIAGNOSIS — E063 Autoimmune thyroiditis: Secondary | ICD-10-CM

## 2022-01-13 DIAGNOSIS — Z114 Encounter for screening for human immunodeficiency virus [HIV]: Secondary | ICD-10-CM

## 2022-01-13 LAB — POCT WET PREP (WET MOUNT)
Clue Cells Wet Prep Whiff POC: POSITIVE
Trichomonas Wet Prep HPF POC: ABSENT

## 2022-01-13 MED ORDER — FLUCONAZOLE 150 MG PO TABS: 150.0000 mg | ORAL_TABLET | Freq: Once | ORAL | 0 refills | Status: AC

## 2022-01-13 MED ORDER — METRONIDAZOLE 500 MG PO TABS: 500.0000 mg | ORAL_TABLET | Freq: Two times a day (BID) | ORAL | 0 refills | Status: DC

## 2022-01-13 NOTE — Progress Notes (Cosign Needed)
    SUBJECTIVE:   CHIEF COMPLAINT / HPI:   Diabetes checkup Patient is presenting for diabetes checkup.  She was seen 1 month ago and had not been taking her metformin.  Hemoglobin A1c was 8.9.  She was not taking the metformin due to GI side effects.  Discussed trialing metformin XR and slowly titrating the medication up.  Patient reports that since doing this she has had no GI symptoms and is tolerating the medication well.  She is currently taking two 500 mg tablets in the morning and one 500 mg tablet at night.  Reports that her fasting blood sugars have been in the 120s to 130s.  She does take postprandial blood sugars but does not wait 2 hours and they are sometimes up to 200.  Denies any signs of hypo or hyperglycemia.  STD screening Is sexually active with 1 partner.  Reports that her partner cheated on her and she would like testing for STDs.  Denies any symptoms.  Of note patient did test positive for syphilis with previous STD check and was treated with 1 dose of 2,400,000 units of penicillin G.   OBJECTIVE:   BP 108/88   Pulse 78   Ht 5\' 2"  (1.575 m)   Wt 215 lb 8 oz (97.8 kg)   LMP 12/24/2021   SpO2 100%   BMI 39.42 kg/m   General: Pleasant 26 year old female in no acute distress Cardiac: Regular rate and rhythm, no murmurs appreciated Respiratory: Work of breathing, lungs clear to auscultation GU: Normal external female genitalia, no abnormal vaginal discharge, normal internal female tissue with nonfriable cervix and no visible lesions  ASSESSMENT/PLAN:   Hypothyroidism, acquired, autoimmune Previous TSH mildly elevated.  Will repeat TSH today to determine if patient needs further treatment with Synthroid.  Type 2 diabetes mellitus with hyperglycemia, without long-term current use of insulin (HCC) Patient's blood sugars improving with metformin.  We will continue to titrate to 1000 mg twice daily.  Follow-up in 2 months for repeat hemoglobin A1c and consider additional  medications at that time.  Screening for STD (sexually transmitted disease) STD screenings obtained today.  RPR and HIV also ordered.  STD screening came back negative.  RPR titer 1: 64 which is the same as it was 1 month ago.  Patient did have STD screening approximately 10 months ago with negative RPR.  Will discuss with attending regarding if repeat RPR should be collected and 2 months versus retreating.  Will need to determine appropriate treatment because patient did have reaction after penicillin dose with rash as well as GI side effects.     34, MD [facility]

## 2022-01-13 NOTE — Patient Instructions (Signed)
It was good seeing you today!  Your diabetes is improving with the metformin.  I want you to continue increasing her dose to 1000 mg twice daily.  We are screening for STDs and we will treat as needed.  Regarding the blood test we will check for HIV as well as syphilis.  I am also going to check your thyroid levels to see if you need to restart your Synthroid.  Please follow-up with your diabetes in 2 months.  If you have any questions or concerns please call the clinic.  I hope you have a wonderful day!

## 2022-01-14 ENCOUNTER — Telehealth: Payer: Self-pay | Admitting: Family Medicine

## 2022-01-14 DIAGNOSIS — A539 Syphilis, unspecified: Secondary | ICD-10-CM

## 2022-01-14 LAB — CERVICOVAGINAL ANCILLARY ONLY
Chlamydia: NEGATIVE
Comment: NEGATIVE
Comment: NEGATIVE
Comment: NORMAL
Neisseria Gonorrhea: NEGATIVE
Trichomonas: NEGATIVE

## 2022-01-14 MED ORDER — PENICILLIN G BENZATHINE 1200000 UNIT/2ML IM SUSY: 2.4000 10*6.[IU] | PREFILLED_SYRINGE | INTRAMUSCULAR | Status: AC

## 2022-01-14 NOTE — Telephone Encounter (Signed)
Called patient to discuss RPR results.  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  We will treat for 3 doses.

## 2022-01-14 NOTE — Assessment & Plan Note (Addendum)
STD screenings obtained today.  RPR and HIV also ordered.  STD screening came back negative.  RPR titer 1: 64 which is the same as it was 1 month ago.  Patient did have STD screening approximately 10 months ago with negative RPR.  Will discuss with attending regarding if repeat RPR should be collected and 2 months versus retreating.  Will need to determine appropriate treatment because patient did have reaction after penicillin dose with rash as well as GI side effects.

## 2022-01-15 LAB — HIV ANTIBODY (ROUTINE TESTING W REFLEX): HIV Screen 4th Generation wRfx: NONREACTIVE

## 2022-01-15 LAB — RPR: RPR Ser Ql: REACTIVE — AB

## 2022-01-15 LAB — RPR, QUANT+TP ABS (REFLEX)
Rapid Plasma Reagin, Quant: 1:64 {titer} — ABNORMAL HIGH
T Pallidum Abs: REACTIVE — AB

## 2022-01-15 LAB — TSH RFX ON ABNORMAL TO FREE T4: TSH: 2.62 u[IU]/mL (ref 0.450–4.500)

## 2022-01-18 ENCOUNTER — Encounter: Payer: Self-pay | Admitting: Family Medicine

## 2022-06-08 ENCOUNTER — Other Ambulatory Visit (HOSPITAL_COMMUNITY)
Admission: RE | Admit: 2022-06-08 | Discharge: 2022-06-08 | Disposition: A | Discharge status: Home / Self Care | Payer: Self-pay | Source: Ambulatory Visit | Attending: Family Medicine | Admitting: Family Medicine

## 2022-06-08 ENCOUNTER — Encounter: Payer: Self-pay | Admitting: Student

## 2022-06-08 ENCOUNTER — Ambulatory Visit (INDEPENDENT_AMBULATORY_CARE_PROVIDER_SITE_OTHER): Payer: Self-pay | Admitting: Student

## 2022-06-08 VITALS — BP 110/78 | HR 111 | Ht 62.0 in | Wt 221.0 lb

## 2022-06-08 DIAGNOSIS — Z113 Encounter for screening for infections with a predominantly sexual mode of transmission: Secondary | ICD-10-CM | POA: Insufficient documentation

## 2022-06-08 DIAGNOSIS — A599 Trichomoniasis, unspecified: Secondary | ICD-10-CM | POA: Insufficient documentation

## 2022-06-08 DIAGNOSIS — Z124 Encounter for screening for malignant neoplasm of cervix: Secondary | ICD-10-CM

## 2022-06-08 LAB — POCT WET PREP (WET MOUNT)
Clue Cells Wet Prep Whiff POC: POSITIVE
WBC, Wet Prep HPF POC: >20

## 2022-06-08 MED ORDER — METRONIDAZOLE 500 MG PO TABS: 500.0000 mg | ORAL_TABLET | Freq: Two times a day (BID) | ORAL | 0 refills | Status: AC

## 2022-06-08 NOTE — Assessment & Plan Note (Signed)
Patient tested positive for trichomoniasis which is likely contributing to her increased discharge. -Metronidazole 500 mg twice daily for 7 days

## 2022-06-08 NOTE — Progress Notes (Unsigned)
    SUBJECTIVE:   CHIEF COMPLAINT / HPI:   STD screening Pt  has had increased white discharge with slight odor for past week. She denies pain but admits to a slight discomfort  in suprapubic area. She is sexually active with 1 partner, does not use condoms, is not on birth control and does not want to be. No vomiting, no nausea, no fevers.    She was previously positive for syphilis on 12/08/21 and received treatment with penicillin G on 12/15/21. States she had rashes on arms, stomach groin 5 days after penicillin and they resolved on their own.  She states she is still with the same partner, is unsure if he was treated for the syphilis at that time. She was seen again in June 2023 for STD testing.  At that time her RPR titer was 1: 64 - the same from the previous month.  Need for Pap smear Patient agrees to go ahead and have Pap smear done today while we are doing STD screening  Type 2 diabetes Patient is currently taking metformin.  She has not returned for an appointment for her diabetes because she does not have insurance currently.  She states she will be getting insurance in the next month or so and will make an appointment at that time to follow-up her diabetes.  PERTINENT  PMH / PSH: History of syphilis  OBJECTIVE:   Vitals:   06/08/22 0914  BP: 110/78  Pulse: (!) 111  SpO2: 98%     General: NAD, pleasant, able to participate in exam Cardiac: Well perfused Respiratory: Breathing comfortably on room air Abdomen: Bowel sounds present, nontender, non distended, soft Skin: warm and dry, no rashes noted GU: Chaperoned by CMA.  normal-appearing external female genitalia, pink moist vaginal mucosa, cervix mildly friable with excessive white/yellow thick discharge, no cervical motion tenderness Neuro: alert, no obvious focal deficits Psych: Normal affect and mood  ASSESSMENT/PLAN:   Trichimoniasis Patient tested positive for trichomoniasis which is likely contributing to her  increased discharge. -Metronidazole 500 mg twice daily for 7 days  Screening for STD (sexually transmitted disease) -Wet prep -Gonorrhea and chlamydia test -HIV test -RPR with reflex, depending on titer patient may need further treatment for syphilis   Patient is due for routine Pap smear but would like to hold off on this until she gets insurance.  She will return at a later date for Pap smear.  Dr. Erick Alley, DO [facility]

## 2022-06-08 NOTE — Patient Instructions (Signed)
It was great to see you! Thank you for allowing me to participate in your care!   We are checking some labs today, I will call you if they are abnormal will send you a MyChart message or a letter if they are normal.  If you do not hear about your labs in the next 2 weeks please let us know.  Take care and seek immediate care sooner if you develop any concerns.   Dr. Kimaya Whitlatch, DO [facility]  

## 2022-06-08 NOTE — Assessment & Plan Note (Signed)
-  Wet prep -Gonorrhea and chlamydia test -HIV test -RPR with reflex, depending on titer patient may need further treatment for syphilis

## 2022-06-10 LAB — CERVICOVAGINAL ANCILLARY ONLY
Chlamydia: POSITIVE — AB
Comment: NEGATIVE
Comment: NORMAL
Neisseria Gonorrhea: NEGATIVE

## 2022-06-10 LAB — RPR, QUANT+TP ABS (REFLEX)
Rapid Plasma Reagin, Quant: 1:16 {titer} — ABNORMAL HIGH
T Pallidum Abs: REACTIVE — AB

## 2022-06-10 LAB — HIV ANTIBODY (ROUTINE TESTING W REFLEX): HIV Screen 4th Generation wRfx: NONREACTIVE

## 2022-06-10 LAB — RPR: RPR Ser Ql: REACTIVE — AB

## 2022-06-11 ENCOUNTER — Telehealth: Payer: Self-pay | Admitting: Student

## 2022-06-11 DIAGNOSIS — A749 Chlamydial infection, unspecified: Secondary | ICD-10-CM

## 2022-06-11 MED ORDER — DOXYCYCLINE HYCLATE 100 MG PO TABS: 100.0000 mg | ORAL_TABLET | Freq: Two times a day (BID) | ORAL | 0 refills | Status: AC

## 2022-06-11 NOTE — Telephone Encounter (Signed)
Spoke with pt on the phone and let her know she tested positive for chlamydia. She denies any lower abdominal pain, fever, vomiting, or feeling generally unwell. She did not have cervical motion tenderness on exam earlier this week so I am not concerned for PID. She agrees to treatment with doxycycline and will also continue taking metronidazole to treat trichomoniasis.  We discussed the importance of refraining from sex until she completes antibiotic treatment  and that her partner(s) need to be treated as well and that the infections can be passed back and forth especially if not using condoms. Considering her history of multiple STDs including syphilis earlier this year, we discussed PrEP therapy. She expressed interest in starting PrEP therapy after she gets insurance. She would like to further discuss this when she returns in about 3 months for re-testing and a pap smear.

## 2022-07-07 ENCOUNTER — Encounter: Payer: Self-pay | Admitting: Student

## 2022-08-02 ENCOUNTER — Ambulatory Visit (INDEPENDENT_AMBULATORY_CARE_PROVIDER_SITE_OTHER): Payer: Self-pay | Admitting: Student

## 2022-08-02 VITALS — BP 118/88 | HR 86 | Ht 62.0 in | Wt 223.1 lb

## 2022-08-02 DIAGNOSIS — A599 Trichomoniasis, unspecified: Secondary | ICD-10-CM

## 2022-08-02 DIAGNOSIS — N898 Other specified noninflammatory disorders of vagina: Secondary | ICD-10-CM

## 2022-08-02 DIAGNOSIS — Z124 Encounter for screening for malignant neoplasm of cervix: Secondary | ICD-10-CM

## 2022-08-02 LAB — POCT WET PREP (WET MOUNT): Clue Cells Wet Prep Whiff POC: NEGATIVE

## 2022-08-02 MED ORDER — METRONIDAZOLE 500 MG PO TABS: 500.0000 mg | ORAL_TABLET | Freq: Two times a day (BID) | ORAL | 0 refills | Status: AC

## 2022-08-02 NOTE — Patient Instructions (Signed)
It was great to see you! Thank you for allowing me to participate in your care!   Our plans for today:  - I will let you know what your pap smear results show and call in a medication if needed!  Take care and seek immediate care sooner if you develop any concerns.  Gerrit Heck, MD

## 2022-08-02 NOTE — Progress Notes (Signed)
    SUBJECTIVE:   CHIEF COMPLAINT / HPI:   Vaginal Discharge: Patient is a 27 y.o. female presenting with vaginal discharge for 7 days.  She states the discharge is of yellowish consistency.  She endorses difference in vaginal odor.  She is not interested in screening for sexually transmitted infections today. She declines contraception today.  She is due for pap smear today however she declines this given she does not have insurance coverage and would like to obtain this before getting her Pap smear.  PERTINENT  PMH / PSH: None relevant  OBJECTIVE:   BP 118/88   Pulse 86   Ht 5\' 2"  (1.575 m)   Wt 223 lb 2 oz (101.2 kg)   LMP 07/12/2022   SpO2 100%   BMI 40.81 kg/m    General: NAD, pleasant, able to participate in exam Respiratory: Normal effort, no obvious respiratory distress Pelvic: VULVA: normal appearing vulva with no masses, tenderness or lesions, VAGINA: Normal appearing vagina with normal color, no lesions, with white discharge present, CERVIX: No lesions, white discharge present  Chaperone Glori Bickers CMA present for pelvic exam  ASSESSMENT/PLAN:   Assessment:  27 y.o. female with vaginal discharge for 7 days, as well as odor.  Physical exam significant for whitish discharge.  Wet prep performed today shows many bacteria and trichomonas consistent with trichomonas infection + possible other STI.  Patient is not interested in STI screening today.  I called patient with results and discussed that I would recommend her to be tested for other STIs, and have her return or be treated as well. Discussed having partners treated. Patient was amenable to this and I scheduled her for an appointment tomorrow morning for collection of G/C potentially RPR and HIV if amenable. Plan: -Wet prep as above.  Will treat with metronidazole 500 BID for 7 days. -Discussed protection during intercourse and contraceptive methods -Follow-up as needed -Discussed obtaining pap smear when she has  insurance -appt tomorrow for G/C collection possible RPR/HIV  Gerrit Heck, MD [place]

## 2022-08-03 ENCOUNTER — Ambulatory Visit: Payer: Self-pay

## 2022-08-03 NOTE — Progress Notes (Signed)
  SUBJECTIVE:   CHIEF COMPLAINT / HPI:   Patient presented on 08/02/2022 for vaginal discharge x 7 days.  On wet prep was found to have trichomonas and prescribed metronidazole 500 mg twice daily x 7 days.  She is on day 2 of this medication.  She presents today hoping to be screened for gonorrhea/chlamydia and further tested for syphilis and HIV.  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  PERTINENT  PMH / PSH: T2DM, trichomonas  OBJECTIVE:  BP 116/78   Pulse 74   Ht 5\' 2"  (1.575 m)   Wt 225 lb (102.1 kg)   LMP 07/12/2022   SpO2 97%   BMI 41.15 kg/m  Pelvic exam: normal external genitalia, vulva, vagina, cervix, uterus and adnexa, VULVA: normal appearing vulva with no masses, tenderness or lesions, VAGINA: normal appearing vagina with normal color and discharge, no lesions, vaginal discharge - white and curd-like, CERVIX: normal appearing cervix without discharge or lesions, exam chaperoned by Londell Moh.   ASSESSMENT/PLAN:  Screening for STD (sexually transmitted disease) Assessment & Plan: She does appear to what looks like a small amount of yeast.  I did not appreciate any signs of STD like discharge.  Continue metronidazole.  Of note, she has tested reactive with RPR in the past and I would expect this to continue.  However, will likely only need treatment if having a fourfold increase in titer.  Unfortunately unable to do Pap today as she did not have insurance.  She is aware that this is recommended.  Orders: -     RPR -     HIV Antibody (routine testing w rflx) -     Hepatitis panel, acute -     POCT Wet Prep [facility]) -     Cervicovaginal ancillary only  Vaginal candidiasis Assessment & Plan: Yeast on wet prep.  Likely 2/2 metronidazole treatment.  Diflucan x 1 to take after completing metronidazole.  Orders: -     Fluconazole; Take 1 tablet (150 mg total) by  mouth once for 1 dose.  Dispense: 1 tablet; Refill: 0  Return if symptoms worsen or fail to improve. [name], DO 08/04/2022, 10:08 AM PGY-2, [place]

## 2022-08-04 ENCOUNTER — Ambulatory Visit (INDEPENDENT_AMBULATORY_CARE_PROVIDER_SITE_OTHER): Payer: Self-pay | Admitting: Student

## 2022-08-04 ENCOUNTER — Other Ambulatory Visit (HOSPITAL_COMMUNITY)
Admission: RE | Admit: 2022-08-04 | Discharge: 2022-08-04 | Disposition: A | Discharge status: Home / Self Care | Payer: Self-pay | Source: Ambulatory Visit | Attending: Family Medicine | Admitting: Family Medicine

## 2022-08-04 VITALS — BP 116/78 | HR 74 | Ht 62.0 in | Wt 225.0 lb

## 2022-08-04 DIAGNOSIS — B3731 Acute candidiasis of vulva and vagina: Secondary | ICD-10-CM

## 2022-08-04 DIAGNOSIS — Z113 Encounter for screening for infections with a predominantly sexual mode of transmission: Secondary | ICD-10-CM

## 2022-08-04 HISTORY — DX: Acute candidiasis of vulva and vagina: B37.31

## 2022-08-04 LAB — POCT WET PREP (WET MOUNT): Clue Cells Wet Prep Whiff POC: NEGATIVE

## 2022-08-04 MED ORDER — FLUCONAZOLE 150 MG PO TABS: 150.0000 mg | ORAL_TABLET | Freq: Once | ORAL | 0 refills | Status: AC

## 2022-08-04 NOTE — Assessment & Plan Note (Signed)
Yeast on wet prep.  Likely 2/2 metronidazole treatment.  Diflucan x 1 to take after completing metronidazole.

## 2022-08-04 NOTE — Assessment & Plan Note (Signed)
She does appear to what looks like a small amount of yeast.  I did not appreciate any signs of STD like discharge.  Continue metronidazole.  Of note, she has tested reactive with RPR in the past and I would expect this to continue.  However, will likely only need treatment if having a fourfold increase in titer.  Unfortunately unable to do Pap today as she did not have insurance.  She is aware that this is recommended.

## 2022-08-04 NOTE — Patient Instructions (Signed)
It was great to see you today! Thank you for choosing [facility] for your primary care. Shelley Payne was seen for STD screening.  Today we addressed: We are screening for gonorrhea, chlamydia, syphilis, HIV, hepatitis.  If you haven't already, sign up for My Chart to have easy access to your labs results, and communication with your primary care physician.  We are checking some labs today. If they are abnormal, I will call you. If they are normal, I will send you a MyChart message (if it is active) or a letter in the mail. If you do not hear about your labs in the next 2 weeks, please call the office.  You should return to our clinic Return if symptoms worsen or fail to improve. Please arrive 15 minutes before your appointment to ensure smooth check in process.  We appreciate your efforts in making this happen.  Thank you for allowing me to participate in your care, [name], DO 08/04/2022, 9:30 AM PGY-2, [place]

## 2022-08-06 LAB — RPR: RPR Ser Ql: REACTIVE — AB

## 2022-08-06 LAB — CERVICOVAGINAL ANCILLARY ONLY
Chlamydia: NEGATIVE
Comment: NEGATIVE
Comment: NORMAL
Neisseria Gonorrhea: NEGATIVE

## 2022-08-06 LAB — ACUTE VIRAL HEPATITIS (HAV, HBV, HCV)
HCV Ab: NONREACTIVE
Hep A IgM: NEGATIVE
Hep B C IgM: NEGATIVE
Hepatitis B Surface Ag: NEGATIVE

## 2022-08-06 LAB — HCV INTERPRETATION

## 2022-08-06 LAB — RPR, QUANT+TP ABS (REFLEX)
Rapid Plasma Reagin, Quant: 1:16 {titer} — ABNORMAL HIGH
T Pallidum Abs: REACTIVE — AB

## 2022-08-06 LAB — HIV ANTIBODY (ROUTINE TESTING W REFLEX): HIV Screen 4th Generation wRfx: NONREACTIVE

## 2022-08-27 ENCOUNTER — Other Ambulatory Visit (HOSPITAL_COMMUNITY)
Admission: RE | Admit: 2022-08-27 | Discharge: 2022-08-27 | Disposition: A | Discharge status: Home / Self Care | Payer: Self-pay | Source: Ambulatory Visit | Attending: Family Medicine | Admitting: Family Medicine

## 2022-08-27 ENCOUNTER — Encounter: Payer: Self-pay | Admitting: Student

## 2022-08-27 ENCOUNTER — Ambulatory Visit (INDEPENDENT_AMBULATORY_CARE_PROVIDER_SITE_OTHER): Payer: Self-pay | Admitting: Student

## 2022-08-27 VITALS — BP 130/95 | HR 86 | Ht 62.0 in | Wt 228.0 lb

## 2022-08-27 DIAGNOSIS — Z7189 Other specified counseling: Secondary | ICD-10-CM | POA: Insufficient documentation

## 2022-08-27 DIAGNOSIS — Z113 Encounter for screening for infections with a predominantly sexual mode of transmission: Secondary | ICD-10-CM | POA: Insufficient documentation

## 2022-08-27 DIAGNOSIS — A599 Trichomoniasis, unspecified: Secondary | ICD-10-CM

## 2022-08-27 LAB — POCT WET PREP (WET MOUNT): Clue Cells Wet Prep Whiff POC: POSITIVE

## 2022-08-27 MED ORDER — METRONIDAZOLE 500 MG PO TABS: 500.0000 mg | ORAL_TABLET | Freq: Two times a day (BID) | ORAL | 0 refills | Status: DC

## 2022-08-27 NOTE — Assessment & Plan Note (Signed)
Patient desires pregnancy.  She is taking prenatal vitamins currently.  Discussed prevention of STIs while trying to get pregnant.

## 2022-08-27 NOTE — Patient Instructions (Signed)
It was great to see you today!   Today we addressed: STD check, you should have your results back by early next week. I will call and prescribe medications if indicated.  You are scheduled to return in 1 week to remove the wart. Please dress in comfortably clothes that day to reduce irritation.   You should return to our clinic Return in 1 week (on 09/03/2022).  Please arrive 15 minutes before your appointment to ensure smooth check in process.    Please call the clinic at [phone] if your symptoms worsen or you have any concerns.  Thank you for allowing me to participate in your care, Dr. Darci Current [facility]

## 2022-08-27 NOTE — Progress Notes (Signed)
    SUBJECTIVE:   CHIEF COMPLAINT / HPI:   Shelley Payne is a 27 y.o. female  presenting for STD testing and concern for a skin tag.   Previous history of syphilis 8 months ago with RPR ratio 1:64, 3 weeks ago tested 1:16.  She reports having a new partner within 3 weeks where she had unprotected sex.  She is concerned for new STIs due to having increased vaginal discharge and odor.  PERTINENT  PMH / PSH: HTN, syphilis, type 2 diabetes  OBJECTIVE:   BP (!) 130/95   Pulse 86   Ht 5\' 2"  (1.575 m)   Wt 228 lb (103.4 kg)   LMP 08/08/2022   SpO2 100%   BMI 41.70 kg/m   Well-appearing, no acute distress Cardio: Regular rate, regular rhythm, no murmurs on exam. Pulm: Clear, no wheezing, no crackles. No increased work of breathing Abdominal: bowel sounds present, soft, non-tender, non-distended Extremities: no peripheral edema    Pelvic Exam: MA chaperone present  Normal external genitalia, singular pedunculated skin tag located on left vulva, not irritated, no signs of infection White frothy discharge, malodorous No cervical motion tenderness  Cervix visualized with no lesions Patient tolerated exam well  ASSESSMENT/PLAN:   Screening for STD (sexually transmitted disease) Obtained wet prep, GC, HIV, RPR testing per patient request and new unprotected encounters with sexual partners.  -Will call patient with results and treat as indicated -Patient scheduled to follow-up next Friday 2/9 for procedure to remove vulvar skin tag -Discussed with patient about using condoms with new sexual partners consistently -Due for Pap smear, patient does not have insurance for coverage.  She is working on obtaining and will schedule when able.   Prenatal consult Patient desires pregnancy.  She is taking prenatal vitamins currently.  Discussed prevention of STIs while trying to get pregnant.  Trichimoniasis Trichomonas present on wet prep 08/27/2022 -Prescribed metronidazole 500 mg twice  daily -Discussed with patient about informing sexual partners    Darci Current, [place]

## 2022-08-27 NOTE — Assessment & Plan Note (Addendum)
Trichomonas present on wet prep 08/27/2022 -Prescribed metronidazole 500 mg twice daily -Discussed with patient about informing sexual partners

## 2022-08-27 NOTE — Assessment & Plan Note (Addendum)
Obtained wet prep, GC, HIV, RPR testing per patient request and new unprotected encounters with sexual partners.  -Will call patient with results and treat as indicated -Patient scheduled to follow-up next Friday 2/9 for procedure to remove vulvar skin tag -Discussed with patient about using condoms with new sexual partners consistently -Due for Pap smear, patient does not have insurance for coverage.  She is working on obtaining and will schedule when able.

## 2022-08-30 LAB — CERVICOVAGINAL ANCILLARY ONLY
Chlamydia: NEGATIVE
Comment: NEGATIVE
Comment: NEGATIVE
Comment: NORMAL
Neisseria Gonorrhea: NEGATIVE
Trichomonas: POSITIVE — AB

## 2022-08-31 LAB — RPR, QUANT+TP ABS (REFLEX)
Rapid Plasma Reagin, Quant: 1:16 {titer} — ABNORMAL HIGH
T Pallidum Abs: REACTIVE — AB

## 2022-08-31 LAB — RPR: RPR Ser Ql: REACTIVE — AB

## 2022-08-31 LAB — HIV ANTIBODY (ROUTINE TESTING W REFLEX): HIV Screen 4th Generation wRfx: NONREACTIVE

## 2022-09-03 ENCOUNTER — Encounter: Payer: Self-pay | Admitting: Student

## 2022-09-03 ENCOUNTER — Ambulatory Visit (INDEPENDENT_AMBULATORY_CARE_PROVIDER_SITE_OTHER): Payer: Self-pay | Admitting: Student

## 2022-09-03 VITALS — BP 120/64 | HR 74 | Ht 62.0 in | Wt 227.0 lb

## 2022-09-03 DIAGNOSIS — N9089 Other specified noninflammatory disorders of vulva and perineum: Secondary | ICD-10-CM | POA: Insufficient documentation

## 2022-09-03 NOTE — Progress Notes (Signed)
    SUBJECTIVE:   CHIEF COMPLAINT / HPI:   Shelley Payne is a 27 y.o. female  presenting for removal of skin tag located on her left vulva. This has been present for several months and has been causing her irritation and pain.   PERTINENT  PMH / PSH: Hx of trichomonas   OBJECTIVE:   BP 120/64   Pulse 74   Ht 5\' 2"  (1.575 m)   Wt 227 lb (103 kg)   LMP 08/08/2022 (Exact Date)   BMI 41.52 kg/m   Well-appearing, no acute distress Derm: well circumscribed raised 0.5 cm diameter, hyperpigmented lesion with stalk located on left labia, no signs of infection in surrounding area   ASSESSMENT/PLAN:   Skin tag of labia Patient consented to procedure for removal of skin tag.  She was placed in the lithotomy position, skin was prepped with alcohol swab and then numbed with lidocaine/epi at the base of the lesion.  Swabbed with Betadine to reduce risk of infection.  Skin tag removed with scalpel blade.  Bleeding was managed with pressure and silver nitrate.  Skin tag sent to pathology for evaluation per patient request. -Return precautions and care instructions given to patient     Darci Current, [place]

## 2022-09-03 NOTE — Addendum Note (Signed)
Addended by: Darci Current on: 09/03/2022 11:41 AM   Modules accepted: Orders

## 2022-09-03 NOTE — Patient Instructions (Signed)
It was great to see you today!   Today we removed the skin tag. I will send it to pathology and should have the result back within 1-2 weeks. I will call you with the results.   Please follow the instructions on the print out for care. If you begin to have worsening irritation or pain at the site with drainage please come back to the office for evaluation.    Please arrive 15 minutes before your appointment to ensure smooth check in process.    Please call the clinic at [phone] if your symptoms worsen or you have any concerns.  Thank you for allowing me to participate in your care, Dr. Darci Current [facility]

## 2022-09-03 NOTE — Assessment & Plan Note (Addendum)
Patient consented to procedure for removal of skin tag.  She was placed in the lithotomy position, skin was prepped with alcohol swab and then numbed with lidocaine/epi at the base of the lesion.  Swabbed with Betadine to reduce risk of infection.  Skin tag removed with scalpel blade.  Bleeding was managed with pressure and silver nitrate.  Skin tag sent to pathology for evaluation per patient request. -Return precautions and care instructions given to patient

## 2022-09-16 ENCOUNTER — Ambulatory Visit (INDEPENDENT_AMBULATORY_CARE_PROVIDER_SITE_OTHER): Payer: Self-pay | Admitting: Family Medicine

## 2022-09-16 VITALS — BP 118/80 | HR 94 | Ht 62.0 in | Wt 226.2 lb

## 2022-09-16 DIAGNOSIS — N926 Irregular menstruation, unspecified: Secondary | ICD-10-CM

## 2022-09-16 DIAGNOSIS — N898 Other specified noninflammatory disorders of vagina: Secondary | ICD-10-CM

## 2022-09-16 LAB — POCT WET PREP (WET MOUNT): Clue Cells Wet Prep Whiff POC: POSITIVE

## 2022-09-16 LAB — POCT URINE PREGNANCY: Preg Test, Ur: NEGATIVE

## 2022-09-16 MED ORDER — FLUCONAZOLE 150 MG PO TABS: ORAL_TABLET | ORAL | 0 refills | Status: DC

## 2022-09-16 NOTE — Patient Instructions (Addendum)
It was great to see you!  Today was tested for vaginal infections including yeast, BV, and trichomonas. We are also checking a urine pregnancy test.  I will send you a Mychart message with the results or call if needed.  Please abstain from sexual intercourse until we know the results and ALWAYS use condoms to prevent future STIs.  I would highly recommend an additional form of birth control if you do not desire pregnancy-- let us know if you're interested and we can discuss further.  You are long overdue for a diabetes visit-- please schedule this as soon as possible. You should also schedule an appointment for your pap when you get insurance.  Take care, Dr Rock Nephew

## 2022-09-16 NOTE — Assessment & Plan Note (Addendum)
Acute x4 days. Upreg negative. Wet prep consistent with yeast.  Wet prep also shows trich but is too soon to interpret this result as she just completed course of flagyl 6 days ago. -Rx sent for diflucan -Appt scheduled in 3 weeks to repeat trich TOC -Advised abstinence from any sexual activity until that time -Due for pap smear, would be >$200 without insurance so she declined today. Patient plans to get insurance next month during open enrollment and will schedule appt for Pap at that time. -Patient would be candidate for PREP due to h/o syphilis. This was briefly discussed with patient today but is cost prohibitive at this time so should be discussed further when she gets insurance.

## 2022-09-16 NOTE — Progress Notes (Signed)
    SUBJECTIVE:   CHIEF COMPLAINT / HPI:   Vaginal Discharge -present for 4 days -yellow, somewhat thick -associated odor and some vaginal itching -no urinary symptoms (no dysuria, frequency, etc) -recently had trich on 08/27/22, completed course of metronidazole 6 days ago, would like TOC -1 partner, he was also treated -has not been sexually active since then -uses condoms intermittently for contraception -would be ok with pregnancy, not necessarily trying to prevent it -LMP January 14th, current period is late. Took home pregnancy test which was negative  PERTINENT  PMH / PSH: syphilis (treated, titers 1:64 > 1:16)  OBJECTIVE:   BP 118/80   Pulse 94   Ht 5' 2"$  (1.575 m)   Wt 226 lb 3.2 oz (102.6 kg)   LMP 08/08/2022 (Exact Date)   SpO2 98%   BMI 41.37 kg/m   General: NAD, pleasant, able to participate in exam Respiratory: No respiratory distress Skin: warm and dry, no rashes noted Psych: Normal affect and mood Neuro: grossly intact GU/GYN: Exam performed in the presence of a chaperone. External genitalia within normal limits.  Vaginal mucosa pink, moist, normal rugae.  Nonfriable cervix without lesions. Scant white discharge but no bleeding noted on speculum exam.   ASSESSMENT/PLAN:   Vaginal discharge Acute x4 days. Upreg negative. Wet prep consistent with yeast.  Wet prep also shows trich but is too soon to interpret this result as she just completed course of flagyl 6 days ago. -Rx sent for diflucan -Appt scheduled in 3 weeks to repeat trich TOC -Advised abstinence from any sexual activity until that time -Due for pap smear, would be >$200 without insurance so she declined today. Patient plans to get insurance next month during open enrollment and will schedule appt for Pap at that time. -Patient would be candidate for PREP due to h/o syphilis. This was briefly discussed with patient today but is cost prohibitive at this time so should be discussed further when she  gets insurance.   Patient long overdue for diabetes visit. Advised her to schedule ASAP.   Alcus Dad, MD [place]

## 2022-10-07 ENCOUNTER — Ambulatory Visit: Payer: Self-pay | Admitting: Family Medicine

## 2022-11-23 ENCOUNTER — Ambulatory Visit (INDEPENDENT_AMBULATORY_CARE_PROVIDER_SITE_OTHER): Payer: BC Managed Care – PPO | Admitting: Student

## 2022-11-23 ENCOUNTER — Other Ambulatory Visit (HOSPITAL_COMMUNITY)
Admission: RE | Admit: 2022-11-23 | Discharge: 2022-11-23 | Disposition: A | Discharge status: Home / Self Care | Payer: BC Managed Care – PPO | Source: Ambulatory Visit | Attending: Family Medicine | Admitting: Family Medicine

## 2022-11-23 VITALS — BP 124/74 | HR 95 | Ht 62.0 in | Wt 226.2 lb

## 2022-11-23 DIAGNOSIS — Z124 Encounter for screening for malignant neoplasm of cervix: Secondary | ICD-10-CM | POA: Diagnosis not present

## 2022-11-23 DIAGNOSIS — A599 Trichomoniasis, unspecified: Secondary | ICD-10-CM | POA: Diagnosis not present

## 2022-11-23 DIAGNOSIS — N898 Other specified noninflammatory disorders of vagina: Secondary | ICD-10-CM | POA: Diagnosis present

## 2022-11-23 LAB — POCT WET PREP (WET MOUNT): Clue Cells Wet Prep Whiff POC: NEGATIVE

## 2022-11-23 MED ORDER — FLUCONAZOLE 150 MG PO TABS
150.0000 mg | ORAL_TABLET | Freq: Once | ORAL | 0 refills | Status: AC
Start: 2022-11-23 — End: 2022-11-23

## 2022-11-23 MED ORDER — METRONIDAZOLE 500 MG PO TABS: 500.0000 mg | ORAL_TABLET | Freq: Two times a day (BID) | ORAL | 0 refills | Status: AC

## 2022-11-23 NOTE — Progress Notes (Signed)
    SUBJECTIVE:   CHIEF COMPLAINT / HPI:   Vaginal Discharge: Patient is a 27 y.o. female presenting with vaginal discharge for a few days.  She states she has a new sexual partner and would like to check herself for gonorrhea and chlamydia.  She is due for her pap smear today as well and could not get it at last visit due to not having insurance but currently does have insurance.  She also is amenable to discussing PrEP more.  PERTINENT  PMH / PSH: Syphilis treated 1:64>1:16  OBJECTIVE:   BP 124/74   Pulse 95   Ht 5\' 2"  (1.575 m)   Wt 226 lb 3.2 oz (102.6 kg)   LMP 10/07/2022   SpO2 98%   BMI 41.37 kg/m    General: NAD, pleasant, able to participate in exam Respiratory: Normal effort, no obvious respiratory distress Pelvic: VULVA: normal appearing vulva with no masses, tenderness or lesions, VAGINA: Normal appearing vagina with normal color, no lesions, with white discharge present, CERVIX: No lesions, white, yellow, and mucoid discharge present Bimanual exam: No cervical motion tenderness or adnexal masses  Chaperone Clemencia Course CMA present for pelvic exam  ASSESSMENT/PLAN:   Vaginal Discharge 27 y.o. female with vaginal discharge for a few days, as well as new sexual partner.  Physical exam significant for white/yellow discharge.  Wet prep performed today shows trichomonas and few yeast and moderate bacteria.  Consistent with trichomonas infection.  Called and confirmed date of birth and discussed with patient.  Discussed that her partners need to be treated for trichomonas.  Discussed that she should replace any loofah/sex toys as this can cause reinfection as well.  There is some concern for resistant strain of trichomonas.  Discussed with Dr. McDiarmid-tinidazole option but not affordable.  Plan: -Wet prep as above.  Will treat with flagyl BID for 7 days. -PAP smear obtained with G/C testing -Discussed PReP-will obtain BMP today for renal function prior to starting  this-patient to return after this results to discuss this pill further -Follow-up 2 weeks to discuss prep  Levin Erp, MD [facility]

## 2022-11-23 NOTE — Patient Instructions (Signed)
It was great to see you! Thank you for allowing me to participate in your care!   I recommend that you always bring your medications to each appointment as this makes it easy to ensure we are on the correct medications and helps [place] not miss when refills are needed.  Our plans for today:  - We got your Pap smear with gonnorhea and chlamydia -We got a wet prep today -Please set up an appointment after we get your kidney function labs back to talk about PREP more!  We are checking some labs today, I will call you if they are abnormal will send you a MyChart message or a letter if they are normal.  If you do not hear about your labs in the next 2 weeks please let us know.  Take care and seek immediate care sooner if you develop any concerns. Please remember to show up 15 minutes before your scheduled appointment time!  Levin Erp, MD [facility]

## 2022-11-24 ENCOUNTER — Telehealth: Payer: Self-pay

## 2022-11-24 LAB — BASIC METABOLIC PANEL
BUN/Creatinine Ratio: 16 (ref 9–23)
BUN: 8 mg/dL (ref 6–20)
CO2: 19 mmol/L — ABNORMAL LOW (ref 20–29)
Calcium: 9.6 mg/dL (ref 8.7–10.2)
Chloride: 97 mmol/L (ref 96–106)
Creatinine, Ser: 0.51 mg/dL — ABNORMAL LOW (ref 0.57–1.00)
Glucose: 333 mg/dL — ABNORMAL HIGH (ref 70–99)
Potassium: 4.2 mmol/L (ref 3.5–5.2)
Sodium: 136 mmol/L (ref 134–144)
eGFR: 130 mL/min/{1.73_m2} (ref >59)

## 2022-11-24 LAB — CYTOLOGY - PAP
Adequacy: ABSENT
Chlamydia: NEGATIVE
Comment: NEGATIVE
Comment: NORMAL
Diagnosis: NEGATIVE
Neisseria Gonorrhea: NEGATIVE

## 2022-11-24 NOTE — Telephone Encounter (Signed)
-----   Message from Levin Erp, MD sent at 11/24/2022  8:30 AM EDT ----- Could you schedule an appointment with Dr. Melissa Noon to discuss PREP and diabetes?  [place]

## 2022-11-24 NOTE — Telephone Encounter (Signed)
Made appt with Dr. Larita Fife on May 6th at 11:10. Follow up on diabetes.

## 2022-11-29 ENCOUNTER — Ambulatory Visit: Payer: BC Managed Care – PPO | Admitting: Family Medicine

## 2022-11-29 DIAGNOSIS — Z23 Encounter for immunization: Secondary | ICD-10-CM

## 2022-11-29 DIAGNOSIS — E1165 Type 2 diabetes mellitus with hyperglycemia: Secondary | ICD-10-CM

## 2022-12-04 NOTE — Patient Instructions (Signed)
It was wonderful to meet you today. Thank you for allowing me to be a part of your care. Below is a short summary of what we discussed at your visit today:  Diabetes Today your A1c was over 12.  This is very high.   START once daily long acting insulin.  Start with 10 units every day when you wake up to start your day.  Check your blood sugar each day when you get up to start your day.  If your blood sugar is over 200, increase how much insulin you give by 2 units.  For example, if tomorrow your glucose is over 200, give yourself 12 units. The next day, if your fasting blood sugar is over 200, give 14 units.   START Mounjaro 2.5 mg once weekly. Pick any day you like.   COME BACK IN 1 WEEK for a pharmacy appointment with Dr. Raymondo Band.   We collected blood and urine samples to check in on your health. If the results are normal, I will send you a letter or MyChart message. If the results are abnormal, I will give you a call.    I have referred you to ophthalmology for routine diabetic eye exam.  Someone from their office should be calling you in 1 to 2 weeks to schedule an appointment.  If you do not hear from them, let us know. We may need to nudge along the referral.    Prenatal planning START prenatal vitamins every day. START choosing healthy foods and fats, aiming for healthy omega-3's through fish, olive oil, chicken, and [place].   Please bring all of your medications to every appointment! If you have any questions or concerns, please do not hesitate to contact us via phone or MyChart message.   Fayette Pho, MD

## 2022-12-07 ENCOUNTER — Encounter: Payer: Self-pay | Admitting: Family Medicine

## 2022-12-07 ENCOUNTER — Ambulatory Visit (INDEPENDENT_AMBULATORY_CARE_PROVIDER_SITE_OTHER): Payer: BC Managed Care – PPO | Admitting: Family Medicine

## 2022-12-07 VITALS — BP 102/64 | HR 110 | Ht 62.0 in | Wt 227.2 lb

## 2022-12-07 DIAGNOSIS — Z23 Encounter for immunization: Secondary | ICD-10-CM

## 2022-12-07 DIAGNOSIS — Z7189 Other specified counseling: Secondary | ICD-10-CM | POA: Diagnosis not present

## 2022-12-07 DIAGNOSIS — Z3009 Encounter for other general counseling and advice on contraception: Secondary | ICD-10-CM | POA: Insufficient documentation

## 2022-12-07 DIAGNOSIS — E1165 Type 2 diabetes mellitus with hyperglycemia: Secondary | ICD-10-CM | POA: Diagnosis not present

## 2022-12-07 LAB — POCT UA - MICROSCOPIC ONLY
Bacteria, U Microscopic: NONE SEEN
WBC, Ur, HPF, POC: NONE SEEN (ref 0–5)

## 2022-12-07 LAB — POCT GLYCOSYLATED HEMOGLOBIN (HGB A1C): HbA1c, POC (controlled diabetic range): 12.1 % — AB (ref 0.0–7.0)

## 2022-12-07 LAB — POCT URINALYSIS DIP (MANUAL ENTRY)
Bilirubin, UA: NEGATIVE
Glucose, UA: >=1000 mg/dL — AB
Ketones, POC UA: NEGATIVE mg/dL
Leukocytes, UA: NEGATIVE
Nitrite, UA: NEGATIVE
Protein Ur, POC: NEGATIVE mg/dL
Spec Grav, UA: 1.015 (ref 1.010–1.025)
Urobilinogen, UA: 0.2 E.U./dL
pH, UA: 5.5 (ref 5.0–8.0)

## 2022-12-07 MED ORDER — "PEN NEEDLES 3/16"" 31G X 5 MM MISC": 3 refills | Status: DC

## 2022-12-07 MED ORDER — MOUNJARO 2.5 MG/0.5ML ~~LOC~~ SOAJ
2.5000 mg | SUBCUTANEOUS | 0 refills | Status: DC
Start: 2022-12-07 — End: 2023-03-16

## 2022-12-07 MED ORDER — BLOOD GLUCOSE TEST VI STRP
1.0000 | ORAL_STRIP | Freq: Every day | 3 refills | Status: DC
Start: 2022-12-07 — End: 2023-03-16

## 2022-12-07 MED ORDER — LANCET DEVICE MISC
1.0000 | Freq: Every day | 0 refills | Status: AC
Start: 2022-12-07 — End: 2023-01-06

## 2022-12-07 MED ORDER — LANCETS MISC. MISC
1.0000 | Freq: Every day | 3 refills | Status: AC
Start: 2022-12-07 — End: 2023-01-06

## 2022-12-07 MED ORDER — BLOOD GLUCOSE MONITORING SUPPL DEVI
0 refills | Status: AC
Start: 2022-12-07 — End: ?

## 2022-12-07 MED ORDER — INSULIN GLARGINE 100 UNIT/ML SOLOSTAR PEN
10.0000 [IU] | PEN_INJECTOR | SUBCUTANEOUS | 11 refills | Status: DC
Start: 2022-12-07 — End: 2023-04-13

## 2022-12-07 NOTE — Assessment & Plan Note (Signed)
A1c 12.1 today.  Patient currently on metformin monotherapy for the last year at the direction of previous physician.  Obtained urine microalbumin today and referred to ophthalmology for routine diabetic eye exam. - Continue metformin 1000 mg twice daily - Rx glucometer and supplies to check fasting glucose every morning - Start Lantus 10 units daily, titrate up by 2 units each day fasting glucose >200 - Start Mounjaro 2.5 mg weekly x 4 weeks - Return for appointment with Dr. Raymondo Band and pharmacy team in 1 week to help adjust insulin - Return for appointment with myself in 1 month to discuss Mounjaro tolerance and increasing the dose

## 2022-12-07 NOTE — Assessment & Plan Note (Signed)
History of trying for pregnancy x 1 year without success.  Patient is interested in pregnancy, however would like to take a year and get her diabetes under control first which I think is very wise.  See T2DM assessment and plan for diabetes action plan.  Recommended to patient to start taking prenatal vitamins now even though they are still using condoms.  Also discussed healthy eating, including healthy omega-3's and other healthy fats for storage now and use by baby later during future pregnancy.

## 2022-12-07 NOTE — Progress Notes (Signed)
   SUBJECTIVE:   CHIEF COMPLAINT / HPI:   T2DM Currently on metformin for about the last year, reports was taken off insulin last year because of A1c.  Reports some nausea with the metformin. Needs to eat with medicine.  Previously on insulin, didn't like how sluggish she felt after taking the injection Previously took other medications but cannot remember names  Lab Results  Component Value Date   HGBA1C 12.1 (A) 12/07/2022   HGBA1C 8.9 (A) 12/08/2021   HGBA1C 11.4 (A) 08/10/2021   Lab Results  Component Value Date   MICROALBUR 5.8 (H) 11/13/2014   LDLCALC 127 (H) 08/29/2017   CREATININE 0.51 (L) 11/23/2022   Fertility  Wanting to try for pregnancy in about a year Wants to get diabetes under control first  PERTINENT  PMH / PSH:  Patient Active Problem List   Diagnosis Date Noted   Skin tag of labia 09/03/2022   Prenatal consult 08/27/2022   Abnormal uterine bleeding 05/09/2019   Type 2 diabetes mellitus with hyperglycemia, without long-term current use of insulin (HCC) 08/29/2017   Vitamin D deficiency 08/29/2017   Goiter 06/01/2012   Hirsutism 06/01/2012   Microalbuminuria 06/01/2012   Obesity, morbid (HCC) 06/01/2012   Environmental allergies    Thyroiditis, autoimmune    Hypothyroidism, acquired, autoimmune    Acanthosis nigricans, acquired    Hypertension     OBJECTIVE:   BP 102/64   Pulse (!) 110   Ht 5\' 2"  (1.575 m)   Wt 227 lb 3.2 oz (103.1 kg)   LMP 10/03/2022   SpO2 99%   BMI 41.56 kg/m    General: Awake, alert, NAD Cardiac: Regular rate and rhythm, no murmurs Respiratory: CTAB  ASSESSMENT/PLAN:   Type 2 diabetes mellitus with hyperglycemia, without long-term current use of insulin (HCC) A1c 12.1 today.  Patient currently on metformin monotherapy for the last year at the direction of previous physician.  Obtained urine microalbumin today and referred to ophthalmology for routine diabetic eye exam. - Continue metformin 1000 mg twice daily - Rx  glucometer and supplies to check fasting glucose every morning - Start Lantus 10 units daily, titrate up by 2 units each day fasting glucose >200 - Start Mounjaro 2.5 mg weekly x 4 weeks - Return for appointment with Dr. Raymondo Band and pharmacy team in 1 week to help adjust insulin - Return for appointment with myself in 1 month to discuss Mounjaro tolerance and increasing the dose  Prenatal consult History of trying for pregnancy x 1 year without success.  Patient is interested in pregnancy, however would like to take a year and get her diabetes under control first which I think is very wise.  See T2DM assessment and plan for diabetes action plan.  Recommended to patient to start taking prenatal vitamins now even though they are still using condoms.  Also discussed healthy eating, including healthy omega-3's and other healthy fats for storage now and use by baby later during future pregnancy.     Fayette Pho, MD [facility]

## 2022-12-08 LAB — MICROALBUMIN / CREATININE URINE RATIO
Creatinine, Urine: 23.2 mg/dL
Microalb/Creat Ratio: 176 mg/g creat — ABNORMAL HIGH (ref 0–29)
Microalbumin, Urine: 40.9 ug/mL

## 2022-12-09 ENCOUNTER — Telehealth: Payer: Self-pay

## 2022-12-09 ENCOUNTER — Other Ambulatory Visit (HOSPITAL_COMMUNITY): Payer: Self-pay

## 2022-12-09 NOTE — Telephone Encounter (Signed)
Prior Auth for patients medication MOUNJARO approved by [facility] INSURANCE until 12/08/23.  CoverMyMeds KeyTawni Pummel PA Case ID #: [id number]

## 2022-12-09 NOTE — Telephone Encounter (Signed)
A Prior Authorization was initiated for this patients MOUNJARO through CoverMyMeds.   Key: [id number]

## 2022-12-14 ENCOUNTER — Ambulatory Visit: Payer: BC Managed Care – PPO | Admitting: Pharmacist

## 2022-12-17 ENCOUNTER — Ambulatory Visit: Payer: BC Managed Care – PPO | Admitting: Pharmacist

## 2022-12-18 ENCOUNTER — Encounter: Payer: Self-pay | Admitting: Student

## 2022-12-21 NOTE — Progress Notes (Unsigned)
    SUBJECTIVE:   CHIEF COMPLAINT / HPI:   Vaginal Discharge: Patient is a 27 y.o. female presenting with vaginal discharge for *** days.  She states the discharge is of *** consistency.  She endorses *** vaginal odor.  She is interested in screening for sexually transmitted infections today.  PERTINENT  PMH / PSH: ***None relevant  OBJECTIVE:   LMP 10/03/2022    General: NAD, pleasant, able to participate in exam Respiratory: Normal effort, no obvious respiratory distress Pelvic: VULVA: normal appearing vulva with no masses, tenderness or lesions, VAGINA: Normal appearing vagina with normal color, no lesions, with {GYN VAGINAL DISCHARGE:21986} discharge present, ***CERVIX: No lesions, {GYN VAGINAL DISCHARGE:21986} discharge present,  Chaperone *** present for pelvic exam  ASSESSMENT/PLAN:   No problem-specific Assessment & Plan notes found for this encounter.    Assessment:  27 y.o. female with vaginal discharge for***days, as well as***.  Physical exam significant for*** discharge.  Wet prep performed today shows *** consistent with ***.  Patient is interested in STI screening.   Plan: -Wet prep as above.  Will treat with***. -GC/chlamydia pending -Will check HIV, Hep B and RPR -Contraception: ***. Discussed importance of condoms in protecting against STI. Offered condoms.  -Pap smear UTD   Sabino Dick, DO [facility] Center

## 2022-12-22 ENCOUNTER — Other Ambulatory Visit: Payer: Self-pay

## 2022-12-22 ENCOUNTER — Ambulatory Visit (INDEPENDENT_AMBULATORY_CARE_PROVIDER_SITE_OTHER): Payer: BC Managed Care – PPO | Admitting: Family Medicine

## 2022-12-22 ENCOUNTER — Other Ambulatory Visit: Payer: Self-pay | Admitting: Family Medicine

## 2022-12-22 ENCOUNTER — Encounter: Payer: Self-pay | Admitting: Family Medicine

## 2022-12-22 ENCOUNTER — Other Ambulatory Visit (HOSPITAL_COMMUNITY)
Admission: RE | Admit: 2022-12-22 | Discharge: 2022-12-22 | Disposition: A | Discharge status: Home / Self Care | Payer: BC Managed Care – PPO | Source: Ambulatory Visit | Attending: Family Medicine | Admitting: Family Medicine

## 2022-12-22 VITALS — BP 117/83 | HR 96 | Ht 62.0 in | Wt 226.0 lb

## 2022-12-22 DIAGNOSIS — Z113 Encounter for screening for infections with a predominantly sexual mode of transmission: Secondary | ICD-10-CM | POA: Diagnosis present

## 2022-12-22 LAB — POCT WET PREP (WET MOUNT): Clue Cells Wet Prep Whiff POC: POSITIVE

## 2022-12-22 MED ORDER — METRONIDAZOLE 500 MG PO TABS: 500.0000 mg | ORAL_TABLET | Freq: Two times a day (BID) | ORAL | 0 refills | Status: AC

## 2022-12-22 NOTE — Patient Instructions (Addendum)
It was wonderful to see you today.  Please bring ALL of your medications with you to every visit.   Today we talked about:  -We are checking for sexually transmitted infections including chlamydia, gonorrhea, trichomonas. I will let you know of the results via MyChart or telephone call. We are also checking for bacterial vaginosis and yeast, which are not sexually transmitted infections.   -You should abstain from sexual activity until we have the results. If your test is positive for a sexually transmitted infection, it is important that both you and your partner are both treated.  -It is always important to use barrier protection, such as condoms, to help prevent sexually transmitted infections.    Thank you for coming to your visit as scheduled. We have had a large "no-show" problem lately, and this significantly limits our ability to see and care for patients. As a friendly reminder- if you cannot make your appointment please call to cancel. We do have a no show policy for those who do not cancel within 24 hours. Our policy is that if you miss or fail to cancel an appointment within 24 hours, 3 times in a 85-month period, you may be dismissed from our clinic.   Thank you for choosing [facility].   Please call [phone] with any questions about today's appointment.  Please be sure to schedule follow up at the front  desk before you leave today.   Sabino Dick, DO PGY-3 Family Medicine    .fmcsw

## 2022-12-24 LAB — CERVICOVAGINAL ANCILLARY ONLY
Chlamydia: NEGATIVE
Comment: NEGATIVE
Comment: NORMAL
Neisseria Gonorrhea: NEGATIVE

## 2022-12-30 ENCOUNTER — Ambulatory Visit: Payer: BC Managed Care – PPO | Admitting: Pharmacist

## 2023-01-03 ENCOUNTER — Ambulatory Visit: Payer: BC Managed Care – PPO | Admitting: Student

## 2023-01-13 ENCOUNTER — Ambulatory Visit (INDEPENDENT_AMBULATORY_CARE_PROVIDER_SITE_OTHER): Payer: BC Managed Care – PPO | Admitting: Family Medicine

## 2023-01-13 VITALS — BP 126/81 | HR 82 | Ht 62.0 in | Wt 225.0 lb

## 2023-01-13 DIAGNOSIS — A599 Trichomoniasis, unspecified: Secondary | ICD-10-CM

## 2023-01-13 NOTE — Patient Instructions (Addendum)
Your test of cure should be 3 months after you started the antibiotics, so you will need to come back at the end of August

## 2023-01-13 NOTE — Progress Notes (Signed)
    SUBJECTIVE:   CHIEF COMPLAINT / HPI:   STD infection - Has been having positive trich testing the last several test - Not having any discharge currently - Not with the partner who gave her the infection - Most recently completed antibiotics a few weeks ago  - Thought she was told to get her test of cure 2 weeks after treatment  PERTINENT  PMH / PSH: Reviewed  OBJECTIVE:   BP 126/81   Pulse 82   Ht 5\' 2"  (1.575 m)   Wt 225 lb (102.1 kg)   LMP 12/08/2022   SpO2 100%   BMI 41.15 kg/m   General: NAD, well-appearing, well-nourished Respiratory: No respiratory distress, breathing comfortably, able to speak in full sentences Skin: warm and dry, no rashes noted on exposed skin Psych: Appropriate affect and mood  ASSESSMENT/PLAN:   Trichomoniasis Recently treated again after recurrent infections with trichomoniasis. Patient presented too early as she thought she was to rest after 2 weeks (per chart review patient was instructed for a 2 month follow-up). No exam done today as patient not having discharge.  - Trichomoniasis re-testing in 2 months (end of August)     Evelena Leyden, DO [facility] Medicine Center

## 2023-01-13 NOTE — Assessment & Plan Note (Signed)
Recently treated again after recurrent infections with trichomoniasis. Patient presented too early as she thought she was to rest after 2 weeks (per chart review patient was instructed for a 2 month follow-up). No exam done today as patient not having discharge.  - Trichomoniasis re-testing in 2 months (end of August)

## 2023-02-02 NOTE — Progress Notes (Signed)
    SUBJECTIVE:   CHIEF COMPLAINT / HPI:   Vaginal Discharge: Patient is a 27 y.o. female presenting with vaginal discharge for 7 days.  She states the discharge is of clumpy whitish consistency.  She endorses no itching.  She is not  interested in screening for sexually transmitted infections today. She does not use barrier method consistently.  She states that she does have a new sexual partner.  She declines GC or HIV/RPR testing today.  PERTINENT  PMH / PSH: Recurrent trichomonas infections OBJECTIVE:   BP 105/87   Pulse 97   Ht 5\' 2"  (1.575 m)   Wt 219 lb 6.4 oz (99.5 kg)   LMP 01/06/2023   SpO2 100%   BMI 40.13 kg/m    General: NAD, pleasant, able to participate in exam Respiratory: Normal effort, no obvious respiratory distress Pelvic: VULVA: normal appearing vulva with no masses, tenderness or lesions, VAGINA: Normal appearing vagina with normal color, no lesions, with copious, gray, and thin discharge present, CERVIX: No lesions, grayish discharge present  Chaperone Gillermina Phy CMA present for pelvic exam  ASSESSMENT/PLAN:   Assessment:  27 y.o. female with vaginal discharge for 7 days, as well as new sexual partner.  Physical exam significant for grayish discharge.  Wet prep performed today shows clue cells, whiff, trichomonas consistent with BV and trichomonas infection.  She has had multiple recurrent trichomonas infections.  Patient was not interested in STI screening.  I called and spoke with patient on the phone and confirmed date of birth.  I discussed her wet prep results with her.  I discussed with her the importance of having all her sexual partners treated for trichomonas. She states that she has a new sexual partner and she would let them know.  I also let her know that I do recommend STD screening given her results. Plan: -Wet prep as above.  Will treat with Flagyl twice daily for 7 days. -Discussed protection during intercourse and contraceptive  methods -Discussed with patient to set up annual visit for diabetes follow-up as uncontrolled this is not helping and patient will call back to reschedule -Consider PrEP initiation at next visit -Follow-up as needed  Levin Erp, MD [facility] Medicine Center

## 2023-02-03 ENCOUNTER — Ambulatory Visit (INDEPENDENT_AMBULATORY_CARE_PROVIDER_SITE_OTHER): Payer: BC Managed Care – PPO | Admitting: Student

## 2023-02-03 VITALS — BP 105/87 | HR 97 | Ht 62.0 in | Wt 219.4 lb

## 2023-02-03 DIAGNOSIS — N760 Acute vaginitis: Secondary | ICD-10-CM | POA: Diagnosis not present

## 2023-02-03 DIAGNOSIS — B9689 Other specified bacterial agents as the cause of diseases classified elsewhere: Secondary | ICD-10-CM

## 2023-02-03 DIAGNOSIS — A599 Trichomoniasis, unspecified: Secondary | ICD-10-CM | POA: Diagnosis not present

## 2023-02-03 DIAGNOSIS — N898 Other specified noninflammatory disorders of vagina: Secondary | ICD-10-CM

## 2023-02-03 LAB — POCT WET PREP (WET MOUNT): Clue Cells Wet Prep Whiff POC: POSITIVE

## 2023-02-03 MED ORDER — METRONIDAZOLE 500 MG PO TABS
500.0000 mg | ORAL_TABLET | Freq: Two times a day (BID) | ORAL | 0 refills | Status: AC
Start: 2023-02-03 — End: 2023-02-10

## 2023-02-03 NOTE — Patient Instructions (Addendum)
It was great to see you! Thank you for allowing me to participate in your care!   Our plans for today:  - we got a wet prep today and will let you know what this shows  Take care and seek immediate care sooner if you develop any concerns.  Levin Erp, MD

## 2023-02-14 ENCOUNTER — Ambulatory Visit (INDEPENDENT_AMBULATORY_CARE_PROVIDER_SITE_OTHER): Payer: BC Managed Care – PPO | Admitting: Student

## 2023-02-14 ENCOUNTER — Encounter: Payer: Self-pay | Admitting: Student

## 2023-02-14 VITALS — BP 122/84 | HR 72 | Ht 62.0 in | Wt 222.8 lb

## 2023-02-14 DIAGNOSIS — R238 Other skin changes: Secondary | ICD-10-CM | POA: Diagnosis not present

## 2023-02-14 MED ORDER — VALACYCLOVIR HCL 1 G PO TABS: ORAL_TABLET | ORAL | 0 refills | Status: DC

## 2023-02-14 NOTE — Patient Instructions (Addendum)
It was great seeing you today.  As we discussed, - You likely have a viral illness that caused your cold sores (the herpes simplex virus). It is easily treated with a medication called acyclovir. If you noticed you are having symptoms again, take this as soon as you notice them. The instructions will be written on the medication bottle.  Please return if your symptoms are not improved in 1-2 weeks.   If you have any questions or concerns, please feel free to call the clinic.   Have a wonderful day,  Dr. Darral Dash [facility] [phone]

## 2023-02-14 NOTE — Progress Notes (Signed)
    SUBJECTIVE:   CHIEF COMPLAINT / HPI:   Shelley Payne is a pleasant 27 year old female here to discuss concern for oral lesions around her lips and mouth. Started 1 week ago, with an itchy sensation which then developed into small bumps She has never had anything like this in the past No lesions inside of her mouth or elsewhere Has not used any new products (make out, lotions, facial ulcers) She did engage in unprotected oral intercourse and wonders if this is when she may have developed her current condition No fevers or chills or other systemic symptoms  PERTINENT  PMH / PSH: T2DM, HTN  OBJECTIVE:   BP 122/84   Pulse 72   Ht 5\' 2"  (1.575 m)   Wt 222 lb 12.8 oz (101.1 kg)   LMP 01/07/2023   SpO2 99%   BMI 40.75 kg/m   General: Pleasant, no distress HEENT: Small cluster of vesicles over upper lip with scaly base. No lesions inside of the oral cavity.   ASSESSMENT/PLAN:   Vesicles Perioral.  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  Also considered contact dermatitis, but are less likely given that she has small clustered vesicles and scaly base most consistent with HSV. Provided with valacyclovir to take as soon as possible if she should develop symptoms of an outbreak in the future. At this time with symptoms lasting more than a week, not beneficial to take antiviral today. She is to return if symptoms do not improve over the next 1 to 2 weeks.     Darral Dash, DO [facility]

## 2023-02-14 NOTE — Assessment & Plan Note (Signed)
Perioral.  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  Also considered contact dermatitis, but are less likely given that she has small clustered vesicles and scaly base most consistent with HSV. Provided with valacyclovir to take as soon as possible if she should develop symptoms of an outbreak in the future. At this time with symptoms lasting more than a week, not beneficial to take antiviral today. She is to return if symptoms do not improve over the next 1 to 2 weeks.

## 2023-02-14 NOTE — Progress Notes (Deleted)
    SUBJECTIVE:   CHIEF COMPLAINT / HPI:   Noticed bumps 1 week ago with itching/now scabbig   PERTINENT  PMH / PSH: ***  OBJECTIVE:   BP 122/84   Pulse 72   Ht 5\' 2"  (1.575 m)   Wt 222 lb 12.8 oz (101.1 kg)   LMP 01/07/2023   SpO2 99%   BMI 40.75 kg/m  ***   ASSESSMENT/PLAN:   No problem-specific Assessment & Plan notes found for this encounter.     Darral Dash, DO [facility]    {    This will disappear when note is signed, click to select method of visit    :1}

## 2023-02-25 ENCOUNTER — Encounter: Payer: Self-pay | Admitting: Student

## 2023-03-02 ENCOUNTER — Ambulatory Visit: Payer: BC Managed Care – PPO

## 2023-03-02 NOTE — Progress Notes (Deleted)
    SUBJECTIVE:   CHIEF COMPLAINT / HPI:   ***  PERTINENT  PMH / PSH: ***  OBJECTIVE:   LMP 01/07/2023   ***  ASSESSMENT/PLAN:   No problem-specific Assessment & Plan notes found for this encounter.     Levin Erp, MD [facility]

## 2023-03-08 ENCOUNTER — Ambulatory Visit: Payer: BC Managed Care – PPO

## 2023-03-08 NOTE — Progress Notes (Deleted)
  SUBJECTIVE:   CHIEF COMPLAINT / HPI:   STI check - recently became sexually active with a new partner, wants to be checked for STIs. Previously tested for ***.  - preferred gender of partner: *** - Medications tried: *** - Sexually active with *** *** partner(s) - Last sexual encounter: *** - Contraception: *** Symptoms include: {STISXs:28021}   PERTINENT  PMH / PSH: HTN, Hypothyroidism, goiter, DM2, AUB, obesity, H/o trichomoniasis -- Re-testing recommended today     Patient Care Team: Darral Dash, DO as PCP - General (Family Medicine) OBJECTIVE:  LMP 01/07/2023  Physical Exam   ASSESSMENT/PLAN:  There are no diagnoses linked to this encounter. No follow-ups on file. Shelley Martinez, MD 03/08/2023, 11:53 AM PGY-3, [facility] {    This will disappear when note is signed, click to select method of visit    :1} '

## 2023-03-09 ENCOUNTER — Other Ambulatory Visit (HOSPITAL_COMMUNITY)
Admission: RE | Admit: 2023-03-09 | Discharge: 2023-03-09 | Disposition: A | Discharge status: Home / Self Care | Payer: BC Managed Care – PPO | Source: Ambulatory Visit | Attending: Family Medicine | Admitting: Family Medicine

## 2023-03-09 ENCOUNTER — Ambulatory Visit (INDEPENDENT_AMBULATORY_CARE_PROVIDER_SITE_OTHER): Payer: BC Managed Care – PPO | Admitting: Student

## 2023-03-09 ENCOUNTER — Encounter: Payer: Self-pay | Admitting: Student

## 2023-03-09 VITALS — BP 110/69 | HR 85 | Ht 62.0 in | Wt 224.2 lb

## 2023-03-09 DIAGNOSIS — N898 Other specified noninflammatory disorders of vagina: Secondary | ICD-10-CM | POA: Diagnosis not present

## 2023-03-09 DIAGNOSIS — Z113 Encounter for screening for infections with a predominantly sexual mode of transmission: Secondary | ICD-10-CM | POA: Insufficient documentation

## 2023-03-09 DIAGNOSIS — E1165 Type 2 diabetes mellitus with hyperglycemia: Secondary | ICD-10-CM

## 2023-03-09 DIAGNOSIS — B3731 Acute candidiasis of vulva and vagina: Secondary | ICD-10-CM | POA: Diagnosis not present

## 2023-03-09 MED ORDER — FLUCONAZOLE 150 MG PO TABS
150.0000 mg | ORAL_TABLET | Freq: Once | ORAL | 0 refills | Status: AC
Start: 2023-03-09 — End: 2023-03-09

## 2023-03-09 NOTE — Progress Notes (Signed)
    SUBJECTIVE:   CHIEF COMPLAINT / HPI:   Vaginal irritation For past week, has had vaginal itching. No pain. No increased or malodorous discharge.  No new soaps, lubricants New sexual partner 2 months ago 7 sexual partners in past year  Hx of trich, chlamydia and syphilis Is interested in PrEP therapy but recalls being told his would be an issue d/t kidney function and uncontrolled T2DM   Not on birth control, does not want to be Not currently using condoms LMP 02/16/23   T2DM Last A1c 12/07/2022 of 12.1  Currently only taking metformin because she cannot afford lantus of mounjaro.  Does not check blood sugars at home.  Microalbumin/cr elevated  PERTINENT  PMH / PSH: History of STDs, T2DM  OBJECTIVE:   BP 110/69   Pulse 85   Ht 5\' 2"  (1.575 m)   Wt 224 lb 3.2 oz (101.7 kg)   LMP 02/16/2023   SpO2 100%   BMI 41.01 kg/m    General: NAD, pleasant, able to participate in exam Cardiac: Well-perfused Respiratory: Breathing comfortably room air GU: Chaperoned by CMA.  No ulcers or vesicular lesions on external genitalia.  Slightly erythematous irritated skin of vulva with visible white thick discharge.  Pink moist vaginal mucosa with white discharge in vaginal canal, normal-appearing cervix with no lesions, masses.  No cervical motion tenderness. Neuro: alert, no obvious focal deficits Psych: Normal affect and mood  ASSESSMENT/PLAN:   Vaginal yeast infection Exam and symptoms consistent with vaginal yeast infection likely due to uncontrolled T2DM. -Diflucan sent to pharmacy -Wet prep for confirmation  Screen for STD (sexually transmitted disease) History of STDs in past and STD risk currently high.  Discussed PrEP with patient which she is interested in.  I will look into PrEP therapy options for her and contact her to further discuss. Will likely have her return for this as we would need to draw baseline labs.  -HIV -RPR -vaginal swab for CG/chlamydia and  trichomoniasis   Type 2 diabetes mellitus with hyperglycemia, without long-term current use of insulin (HCC) Uncontrolled and not taking medications previously prescribed. Appointment made with pharmacy team later this month for A1c and to discuss medication options which may be affordable to her.      Dr. Erick Alley, DO [facility] Center

## 2023-03-09 NOTE — Patient Instructions (Addendum)
It was great to see you! Thank you for allowing me to participate in your care!  I recommend that you always bring your medications to each appointment as this makes it easy to ensure you are on the correct medications and helps [place] not miss when refills are needed.  Our plans for today:  - Return ASAP for appointment with our pharmacy team, PCP, or whoever is available  -Diflucan sent to pharmacy for vaginal yeast infection.   We are checking some labs today, I will call you if they are abnormal will send you a MyChart message or a letter if they are normal.  If you do not hear about your labs in the next 2 weeks please let us know.  Take care and seek immediate care sooner if you develop any concerns.   Dr. Erick Alley, DO [facility]

## 2023-03-09 NOTE — Assessment & Plan Note (Signed)
Exam and symptoms consistent with vaginal yeast infection likely due to uncontrolled T2DM. -Diflucan sent to pharmacy -Wet prep for confirmation

## 2023-03-09 NOTE — Assessment & Plan Note (Signed)
History of STDs in past and STD risk currently high.  Discussed PrEP with patient which she is interested in.  I will look into PrEP therapy options for her and contact her to further discuss. Will likely have her return for this as we would need to draw baseline labs.  -HIV -RPR -vaginal swab for CG/chlamydia and trichomoniasis

## 2023-03-09 NOTE — Assessment & Plan Note (Signed)
Uncontrolled and not taking medications previously prescribed. Appointment made with pharmacy team later this month for A1c and to discuss medication options which may be affordable to her.

## 2023-03-11 ENCOUNTER — Telehealth: Payer: Self-pay | Admitting: Student

## 2023-03-11 DIAGNOSIS — E1165 Type 2 diabetes mellitus with hyperglycemia: Secondary | ICD-10-CM

## 2023-03-11 DIAGNOSIS — Z202 Contact with and (suspected) exposure to infections with a predominantly sexual mode of transmission: Secondary | ICD-10-CM

## 2023-03-11 DIAGNOSIS — A599 Trichomoniasis, unspecified: Secondary | ICD-10-CM

## 2023-03-11 LAB — CERVICOVAGINAL ANCILLARY ONLY
Bacterial Vaginitis (gardnerella): POSITIVE — AB
Candida Glabrata: POSITIVE — AB
Candida Vaginitis: POSITIVE — AB
Chlamydia: NEGATIVE
Comment: NEGATIVE
Comment: NEGATIVE
Comment: NEGATIVE
Comment: NEGATIVE
Comment: NEGATIVE
Comment: NORMAL
Neisseria Gonorrhea: NEGATIVE
Trichomonas: POSITIVE — AB

## 2023-03-11 LAB — RPR, QUANT+TP ABS (REFLEX)
Rapid Plasma Reagin, Quant: 1:8 {titer} — ABNORMAL HIGH
T Pallidum Abs: REACTIVE — AB

## 2023-03-11 LAB — HIV ANTIBODY (ROUTINE TESTING W REFLEX): HIV Screen 4th Generation wRfx: NONREACTIVE

## 2023-03-11 LAB — RPR: RPR Ser Ql: REACTIVE — AB

## 2023-03-11 MED ORDER — METRONIDAZOLE 500 MG PO TABS
500.0000 mg | ORAL_TABLET | Freq: Two times a day (BID) | ORAL | 0 refills | Status: DC
Start: 2023-03-11 — End: 2023-05-10

## 2023-03-11 NOTE — Telephone Encounter (Signed)
Called patient to discuss positive results for BV and trichomonas.  Metronidazole sent to pharmacy.  Discussed importance of notifying sexual partners and that they will need treatment as well.  Discussed abstaining from sexual activity for 7 days after completing treatment.  She will need retesting in about 3 months.  Very interested in PrEP therapy.  Risk and benefits thoroughly discussed today.  Truvada has lower risk of increasing lipids and is FDA approved to use in females.  Will order labs for baseline lipid panel, kidney and liver function, patient can come in for lab only visit.  Recently tested negative for HIV, do not need retesting if she refrains from intercourse prior to starting medication. Will retest for hep B. Once baseline labs obtained, will prescribe PrEP therapy

## 2023-03-16 ENCOUNTER — Other Ambulatory Visit (HOSPITAL_COMMUNITY): Payer: Self-pay

## 2023-03-16 ENCOUNTER — Encounter: Payer: Self-pay | Admitting: Pharmacist

## 2023-03-16 ENCOUNTER — Ambulatory Visit (INDEPENDENT_AMBULATORY_CARE_PROVIDER_SITE_OTHER): Payer: BC Managed Care – PPO | Admitting: Pharmacist

## 2023-03-16 VITALS — BP 116/74 | HR 86 | Ht 62.0 in | Wt 228.0 lb

## 2023-03-16 DIAGNOSIS — E1165 Type 2 diabetes mellitus with hyperglycemia: Secondary | ICD-10-CM

## 2023-03-16 MED ORDER — LANCETS 30G MISC
1.0000 | 11 refills | Status: AC | PRN
Start: 2023-03-16 — End: ?

## 2023-03-16 MED ORDER — SEMAGLUTIDE(0.25 OR 0.5MG/DOS) 2 MG/3ML ~~LOC~~ SOPN
0.2500 mg | PEN_INJECTOR | SUBCUTANEOUS | Status: DC
Start: 2023-03-16 — End: 2023-04-13

## 2023-03-16 MED ORDER — METFORMIN HCL ER 500 MG PO TB24: 500.0000 mg | ORAL_TABLET | Freq: Two times a day (BID) | ORAL | 3 refills | Status: DC

## 2023-03-16 MED ORDER — BLOOD GLUCOSE TEST VI STRP: 1.0000 | ORAL_STRIP | Freq: Every day | 3 refills | Status: AC

## 2023-03-16 NOTE — Assessment & Plan Note (Signed)
Diabetes longstanding since 2019 and currently with poor control related to her medication access.  Patient is able to verbalize appropriate hypoglycemia management plan.  -Initiated a trial of GLP-1 Ozempic (semaglutide) 0.25mg  weekly.- sample provided.  -Decreased dose of metformin from 1000mg  BID to 500mg  BID due to persistent nausea. .  -Patient educated on purpose, proper use, and potential adverse effects of GI side effects with semaglutide.  -Extensively discussed pathophysiology of diabetes, recommended lifestyle interventions, dietary effects on blood sugar control.  -Counseled on s/sx of and management of hypoglycemia.  -Next A1c anticipated 2-3 months.  -Patient uninterested in enrolling with CGM study at this time.

## 2023-03-16 NOTE — Progress Notes (Signed)
    S:     Chief Complaint  Patient presents with   Medication Management    Diabetes office visit, Liberate initial visit - not interested at this time.    27 y.o. female who presents for diabetes evaluation, education, and management. Patient arrives in good spirits and presents without any assistance.  Patient was referred and last seen by Primary Care Provider, Dr. Yetta Barre, on 03/09/2023.   PMH is significant for insurance issues which precluded use of prescribed therapies for her diabetes.   Patient reports Diabetes was diagnosed in 2019.   Family/Social History: works 3rd shift at [employer], patient wants to have a baby and wants to control her diabetes to help increase her chances of conceiving  Current diabetes medications include: metformin  Patient denies adherence with medications due to cost.  Reports costs of medications (insulin, trulicity) are > $250 per prescription.    Do you have any problems obtaining medications due to transportation or finances? yes Insurance coverage: BCBS  Patient denies hypoglycemic events.  Reported not testing home blood sugars routinely.    Patient wakes up 4-5 times per night with nocturia.  O:   Review of Systems  All other systems reviewed and are negative.   Physical Exam   Lab Results  Component Value Date   HGBA1C 12.1 (A) 12/07/2022   Vitals:   03/16/23 1008  BP: 116/74  Pulse: 86  SpO2: 100%    Lipid Panel     Component Value Date/Time   CHOL 181 08/29/2017 1015   TRIG 67 08/29/2017 1015   HDL 41 08/29/2017 1015   CHOLHDL 4.2 02/23/2013 1441   VLDL 21 02/23/2013 1441   LDLCALC 127 (H) 08/29/2017 1015    Clinical Atherosclerotic Cardiovascular Disease (ASCVD): No  The ASCVD Risk score (Arnett DK, et al., 2019) failed to calculate for the following reasons:   The 2019 ASCVD risk score is only valid for ages 36 to 63   Patient is not participating in a Managed Medicaid Plan - ask patient to inquire about  eligibility at [facility] soon.  A/P: Diabetes longstanding since 2019 and currently with poor control related to her medication access.  Patient is able to verbalize appropriate hypoglycemia management plan.  -Initiated a trial of GLP-1 Ozempic (semaglutide) 0.25mg  weekly.- sample provided.  -Decreased dose of metformin from 1000mg  BID to 500mg  BID due to persistent nausea. .  -Patient educated on purpose, proper use, and potential adverse effects of GI side effects with semaglutide.  -Extensively discussed pathophysiology of diabetes, recommended lifestyle interventions, dietary effects on blood sugar control.  -Counseled on s/sx of and management of hypoglycemia.  -Next A1c anticipated 2-3 months.  -Patient uninterested in enrolling with CGM study at this time.   Note: Insurance claim reviewed by Siri Cole and  it appears insulin glargine (Lantus) should be $10 copay and Ozempic (semaglutide) is a PA but is covered.  Re-evaluate insurance coverage following Medicaid eligibility assessment.   Written patient instructions provided. Patient verbalized understanding of treatment plan.  Total time in face to face counseling 32 minutes.    Follow-up:  Pharmacist 04/13/2023 PCP clinic visit in PRN Patient seen with Rickey Primus, PharmD Candidate and Andee Poles, PharmD Candidate.

## 2023-03-16 NOTE — Patient Instructions (Addendum)
It was nice to see you today!  Your goal blood sugar is 80-130 before eating and less than 180 after eating.  Consider going to the 4th floor of the [employer] [facility] to discuss Medicaid eligibility.  Medication Changes: START Ozempic (semaglutide) 0.25mg  once weekly.  DECREASE metformin to 500mg  twice daily.  Continue all other medication the same.   Keep up the good work with diet and exercise. Aim for a diet full of vegetables, fruit and lean meats (chicken, [place], fish). Try to limit salt intake by eating fresh or frozen vegetables (instead of canned), rinse canned vegetables prior to cooking and do not add any additional salt to meals.

## 2023-03-17 NOTE — Progress Notes (Signed)
Reviewed and agree with Dr Koval's plan.   

## 2023-04-12 ENCOUNTER — Ambulatory Visit: Payer: BC Managed Care – PPO | Admitting: Family Medicine

## 2023-04-13 ENCOUNTER — Ambulatory Visit (INDEPENDENT_AMBULATORY_CARE_PROVIDER_SITE_OTHER): Payer: BC Managed Care – PPO | Admitting: Pharmacist

## 2023-04-13 ENCOUNTER — Encounter: Payer: Self-pay | Admitting: Pharmacist

## 2023-04-13 ENCOUNTER — Other Ambulatory Visit (HOSPITAL_COMMUNITY): Payer: Self-pay

## 2023-04-13 VITALS — BP 117/93 | HR 88 | Ht 63.0 in | Wt 228.0 lb

## 2023-04-13 DIAGNOSIS — E1165 Type 2 diabetes mellitus with hyperglycemia: Secondary | ICD-10-CM | POA: Diagnosis not present

## 2023-04-13 MED ORDER — SEMAGLUTIDE(0.25 OR 0.5MG/DOS) 2 MG/3ML ~~LOC~~ SOPN: 0.2500 mg | PEN_INJECTOR | SUBCUTANEOUS | Status: DC

## 2023-04-13 NOTE — Progress Notes (Signed)
S:     Chief Complaint  Patient presents with   Medication Management    Diabetes   27 y.o. female who presents for diabetes evaluation, education, and management. Patient arrives in  good spirits and presents without any assistance.   Patient was referred and last seen by Primary Care Provider, Dr. Yetta Barre, on 03/16/23.   PMH is significant for insurance issues which precluded use of prescribed therapies for her diabetes.  At last visit, initiated a trial of GLP-1 Ozempic (semaglutide) 0.25mg  weekly- sample provided.   Patient reports Diabetes was diagnosed in 2019.   Family/Social History: patient's father and grandfather on maternal side both have DM  Current diabetes medications include: Ozempic (semaglutide) 0.25 mg once weekly, metformin XR 500 mg twice daily Current hypertension medications include: none Current hyperlipidemia medications include: none  Patient reports adherence to taking all medications as prescribed.   Do you feel that your medications are working for you? yes Have you been experiencing any side effects to the medications prescribed? no Do you have any problems obtaining medications due to transportation or finances? yes Insurance coverage: BCBS  Patient denies hypoglycemic events.  Reported home fasting blood sugars: low 200s   Patient reports nocturia (nighttime urination) - improved from before. Patient denies neuropathy (nerve pain). Patient denies visual changes. Patient reports self foot exams.   Patient reported dietary habits: Eats 1 meal/day Breakfast/Lunch/Dinner: mozzarella sticks, jalapeno poppers  Patient-reported exercise habits: standing all day during shifts at [employer]  O:   Review of Systems  All other systems reviewed and are negative.   Physical Exam Pulmonary:     Effort: Pulmonary effort is normal.  Neurological:     Mental Status: She is alert.  Psychiatric:        Mood and Affect: Mood normal.        Behavior:  Behavior normal.        Thought Content: Thought content normal.        Judgment: Judgment normal.    Lab Results  Component Value Date   HGBA1C 12.1 (A) 12/07/2022   Vitals:   04/13/23 0938  BP: (!) 117/93  Pulse: 88  SpO2: 100%    Lipid Panel     Component Value Date/Time   CHOL 181 08/29/2017 1015   TRIG 67 08/29/2017 1015   HDL 41 08/29/2017 1015   CHOLHDL 4.2 02/23/2013 1441   VLDL 21 02/23/2013 1441   LDLCALC 127 (H) 08/29/2017 1015    Clinical Atherosclerotic Cardiovascular Disease (ASCVD): No   A/P: Diabetes diagnosed in 2019 currently uncontrolled with latest A1c (11/2022) of 12.1%. Patient is able to verbalize appropriate hypoglycemia management plan. Medication adherence appears good. Control is suboptimal due to patient not being able to pick up Lantus (insulin glargine) from pharmacy due to cost. - Increased metformin XR from 2 tablets to 3 tablets once daily. - Increased Ozempic (semaglutide) from 0.25 mg to 0.5 mg once weekly. - Instructed patient to check PP and bedtime blood sugars at least 3 times a week and to bring log to next visit. - Discontinued Lantus (insulin glargine). - Consider SGLT2 at next visit (UACR from 11/2022 of 176). -Patient educated on purpose, proper use, and potential adverse effects of increased doses of Ozempic and metformin.  -Extensively discussed pathophysiology of diabetes, recommended lifestyle interventions, dietary effects on blood sugar control.  -Counseled on s/sx of and management of hypoglycemia.   ASCVD risk - primary prevention in patient with diabetes. Last LDL (08/2017)  is 127 not at goal of <70 mg/dL. High intensity statin indicated.  -Consider initiating a statin at next visit.  Written patient instructions provided. Patient verbalized understanding of treatment plan.  Total time in face to face counseling 45 minutes.    Follow-up:  Pharmacist visit with Dr. Raymondo Band on 05/10/23 PCP clinic visit PRN Patient seen  with Alesia Banda, PharmD Candidate and Andee Poles, PharmD Candidate.

## 2023-04-13 NOTE — Assessment & Plan Note (Signed)
Diabetes diagnosed in 2019 currently uncontrolled with latest A1c (11/2022) of 12.1%. Patient is able to verbalize appropriate hypoglycemia management plan. Medication adherence appears good. Control is suboptimal due to patient not being able to pick up Lantus (insulin glargine) from pharmacy due to cost. - Increased metformin XR from 2 tablets to 3 tablets once daily. - Increased Ozempic (semaglutide) from 0.25 mg to 0.5 mg once weekly. - Instructed patient to check PP and bedtime blood sugars at least 3 times a week and to bring log to next visit. - Discontinued Lantus (insulin glargine). - Consider SGLT2 at next visit (UACR from 11/2022 of 176). -Patient educated on purpose, proper use, and potential adverse effects of increased doses of Ozempic and metformin.  -Extensively discussed pathophysiology of diabetes, recommended lifestyle interventions, dietary effects on blood sugar control.  -Counseled on s/sx of and management of hypoglycemia.

## 2023-04-13 NOTE — Patient Instructions (Addendum)
It was nice to see you today!  Your goal blood sugar is 80-130 before eating and less than 180 after eating.  Medication Changes:  Increase metformin XR to 3 tablets daily in the morning.  Increase Ozempic (semaglutide) to 0.5 mg once weekly.   Monitor blood sugars at home and keep a log (glucometer or piece of paper) to bring with you to your next visit. Try to check after meal and bedtime sugars ~3 times a week.  Continue all other medication the same.   Keep up the good work with diet and exercise. Aim for a diet full of vegetables, fruit and lean meats (chicken, [place], fish). Try to limit salt intake by eating fresh or frozen vegetables (instead of canned), rinse canned vegetables prior to cooking and do not add any additional salt to meals.

## 2023-04-14 NOTE — Progress Notes (Signed)
Reviewed and agree with Dr Koval's plan.   

## 2023-04-22 ENCOUNTER — Ambulatory Visit: Payer: BC Managed Care – PPO | Admitting: Family Medicine

## 2023-05-10 ENCOUNTER — Other Ambulatory Visit (HOSPITAL_COMMUNITY): Payer: Self-pay

## 2023-05-10 ENCOUNTER — Ambulatory Visit: Payer: BC Managed Care – PPO | Admitting: Pharmacist

## 2023-05-10 ENCOUNTER — Encounter: Payer: Self-pay | Admitting: Pharmacist

## 2023-05-10 VITALS — BP 108/77 | HR 93 | Wt 225.8 lb

## 2023-05-10 DIAGNOSIS — E1165 Type 2 diabetes mellitus with hyperglycemia: Secondary | ICD-10-CM

## 2023-05-10 MED ORDER — DAPAGLIFLOZIN PROPANEDIOL 10 MG PO TABS: 10.0000 mg | ORAL_TABLET | Freq: Every day | ORAL | 11 refills | Status: DC

## 2023-05-10 MED ORDER — SEMAGLUTIDE (1 MG/DOSE) 4 MG/3ML ~~LOC~~ SOPN
1.0000 mg | PEN_INJECTOR | SUBCUTANEOUS | 11 refills | Status: DC
Start: 2023-05-10 — End: 2023-08-10

## 2023-05-10 NOTE — Progress Notes (Signed)
Reviewed and agree with Dr Koval's plan.   

## 2023-05-10 NOTE — Patient Instructions (Addendum)
It was nice to see you today!  Your goal blood sugar is 80-130 before eating and less than 180 after eating.  Medication Changes: START Farxiga 10 mg 1 tablet PO daily  Increase Semaglutide (Ozempic): Week 1 - one more dose of 0.5mg , then Week 2 - go up to two (0.5mg ) shots for a total of 1mg . Week 3 and on - will get a 1mg  pen, take one shot of new dose.  Continue all other medication the same.   Monitor blood sugars at home and keep a log (glucometer or piece of paper) to bring with you to your next visit.  Keep up the good work with diet and exercise. Aim for a diet full of vegetables, fruit and lean meats (chicken, [place], fish). Try to limit salt intake by eating fresh or frozen vegetables (instead of canned), rinse canned vegetables prior to cooking and do not add any additional salt to meals.

## 2023-05-10 NOTE — Assessment & Plan Note (Signed)
Diabetes longstanding currently uncontrolled. Patient is able to verbalize appropriate hypoglycemia management plan. Medication adherence appears good. Control is suboptimal due to A1c 12.1 (12/07/2022). -Increased dose of GLP-1 Ozempic (semaglutide) from 0.5 mg to 1 mg.  Week 1 - one more dose of 0.5mg , then  Week 2 - go up to two (0.5mg ) shots for a total of 1mg .  Week 3 and future - obtain 1mg  pen take 1mg  weekly. -Started SGLT2 Farxiga (dapagliflozin) 10 mg daily.  -Continued metformin 500mg  2 tab AM and 1 tab PM.  -Patient educated on purpose, proper use, and potential adverse effects of Farxiga and fruits in diet.  -Extensively discussed pathophysiology of diabetes, recommended lifestyle interventions, dietary effects on blood sugar control.

## 2023-05-10 NOTE — Progress Notes (Signed)
S:     Chief Complaint  Patient presents with   Medication Management    T2DM Management    27 y.o. female who presents for diabetes evaluation, education, and management. Patient arrives in good spirits and presents without any assistance. 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  Patient was referred and last seen by Primary Care Provider, Dr. Yetta Barre, on 03/09/2023.  PMH is significant for T2DM.   At last visit, insulin (Lantus) was discontinued, increased metformin & Ozempic. SGLT2 to be considered on this visit. Consider initiating statin at this visit due to LDL 127 on 08/2017   Patient reports Diabetes was diagnosed in 2019.   Current diabetes medications include: metformin, semaglutide  Patient reports adherence to taking all medications as prescribed.   Do you feel that your medications are working for you? yes Have you been experiencing any side effects to the medications prescribed? no Do you have any problems obtaining medications due to transportation or finances? no Insurance coverage: [employer]  Patient denies hypoglycemic events.  Reported 2 hour post-meal/random blood sugars: low-200s.  Patient denies nocturia (nighttime urination).  Patient denies neuropathy (nerve pain). Patient denies visual changes. Patient denies self foot exams.   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 Drinks: Trying to just have water, no juices.  Within the past 12 months, did you worry whether your food would run out before you got money to buy more? no Within the past 12 months, did the food you bought run out, and you didn't have money to get more? no   O:   ROS  Physical Exam Constitutional:      Appearance: Normal appearance.  Neurological:     Mental Status: She is alert.  Psychiatric:        Mood and Affect: Mood normal.         Behavior: Behavior normal.        Thought Content: Thought content normal.        Judgment: Judgment normal.     Observed patterns: low-200s glucose post-meal   Lab Results  Component Value Date   HGBA1C 12.1 (A) 12/07/2022   Vitals:   05/10/23 0850  BP: 108/77  Pulse: 93  SpO2: 100%    Lipid Panel     Component Value Date/Time   CHOL 181 08/29/2017 1015   TRIG 67 08/29/2017 1015   HDL 41 08/29/2017 1015   CHOLHDL 4.2 02/23/2013 1441   VLDL 21 02/23/2013 1441   LDLCALC 127 (H) 08/29/2017 1015     A/P: Diabetes longstanding currently uncontrolled. Patient is able to verbalize appropriate hypoglycemia management plan. Medication adherence appears good. Control is suboptimal due to A1c 12.1 (12/07/2022). -Increased dose of GLP-1 Ozempic (semaglutide) from 0.5 mg to 1 mg.  Week 1 - one more dose of 0.5mg , then  Week 2 - go up to two (0.5mg ) shots for a total of 1mg .  Week 3 and future - obtain 1mg  pen take 1mg  weekly. -Started SGLT2 Farxiga (dapagliflozin) 10 mg daily.  -Continued metformin 500mg  2 tab AM and 1 tab PM.  -Patient educated on purpose, proper use, and potential adverse effects of Farxiga and fruits in diet.  -Extensively discussed pathophysiology of diabetes, recommended lifestyle interventions, dietary effects on blood sugar control.  -Counseled on s/sx of and management of hypoglycemia.   ASCVD risk - primary prevention in patient with diabetes.  Last LDL 127 (08/2017) is not at goal of <70 mg/dL.  high intensity statin indicated.  -Consider initiating a statin at next visit.   Hypertension longstanding currently controlled. Blood pressure goal of <130/80 mmHg.   Written patient instructions provided. Patient verbalized understanding of treatment plan.  Total time in face to face counseling 27 minutes.    Follow-up:  Pharmacist Dr. Raymondo Band, PharmD on 06/09/2023 Patient seen with Caprice Beaver, PharmD Candidate and Shona Simpson, PharmD  Candidate.

## 2023-05-16 ENCOUNTER — Other Ambulatory Visit (HOSPITAL_COMMUNITY): Payer: Self-pay

## 2023-05-19 ENCOUNTER — Telehealth: Payer: Self-pay

## 2023-05-19 NOTE — Telephone Encounter (Signed)
A Prior Authorization was submitted & approved for this patients OZEMPIC 1MG  DOSE PENS through CoverMyMeds.   Key: [id number]

## 2023-05-20 NOTE — Telephone Encounter (Signed)
Pharmacy Patient Advocate Encounter  Received notification from  PHARMACY SERVICES  that Prior Authorization for [facility] has been APPROVED from 03/19/23 to 05/17/24  AN ADDITIONAL APPROVAL LETTER WILL BE MAILED TO OFFICE

## 2023-05-20 NOTE — Telephone Encounter (Signed)
VM left informing pharmacy of ozempic approval.

## 2023-06-01 ENCOUNTER — Other Ambulatory Visit: Payer: Self-pay | Admitting: Family Medicine

## 2023-06-01 ENCOUNTER — Ambulatory Visit: Payer: BC Managed Care – PPO | Admitting: Pharmacist

## 2023-06-01 ENCOUNTER — Other Ambulatory Visit (HOSPITAL_COMMUNITY)
Admission: RE | Admit: 2023-06-01 | Discharge: 2023-06-01 | Disposition: A | Discharge status: Home / Self Care | Payer: BC Managed Care – PPO | Source: Ambulatory Visit | Attending: Family Medicine | Admitting: Family Medicine

## 2023-06-01 ENCOUNTER — Encounter: Payer: Self-pay | Admitting: Family Medicine

## 2023-06-01 ENCOUNTER — Ambulatory Visit: Payer: BC Managed Care – PPO | Admitting: Family Medicine

## 2023-06-01 VITALS — BP 110/75 | HR 85 | Ht 62.0 in | Wt 225.0 lb

## 2023-06-01 DIAGNOSIS — N898 Other specified noninflammatory disorders of vagina: Secondary | ICD-10-CM | POA: Diagnosis not present

## 2023-06-01 DIAGNOSIS — Z113 Encounter for screening for infections with a predominantly sexual mode of transmission: Secondary | ICD-10-CM

## 2023-06-01 DIAGNOSIS — A599 Trichomoniasis, unspecified: Secondary | ICD-10-CM

## 2023-06-01 DIAGNOSIS — Z23 Encounter for immunization: Secondary | ICD-10-CM | POA: Diagnosis not present

## 2023-06-01 LAB — POCT WET PREP (WET MOUNT): Clue Cells Wet Prep Whiff POC: POSITIVE

## 2023-06-01 MED ORDER — METRONIDAZOLE 500 MG PO TABS
500.0000 mg | ORAL_TABLET | Freq: Two times a day (BID) | ORAL | 0 refills | Status: AC
Start: 2023-06-01 — End: 2023-06-08

## 2023-06-01 NOTE — Assessment & Plan Note (Addendum)
Patient has 1 week hx of yellow vaginal discharge. Has not had any sex since September (2 months). Denies itching, burning, pain with urination, or malodor. Given patients history of STIs, there was conversation with PCP re PrEP. Patient has labs pending for w/u prior to initiation but states she is no longer with partner and would not like to pursue at this time. Gave guidance on safe sex practices and using condoms, ensuring partner and patient are both treated for infection.  - Wet Prep - G/C and trichomonas with Cervicovaginal swab

## 2023-06-01 NOTE — Patient Instructions (Signed)
It was wonderful to see you today.  Please bring ALL of your medications with you to every visit.   Today we talked about:  STD testing. I will call you or mychart you with the results. If a medication needs to sent it, I will send you message regarding this.   Thank you for choosing [facility].   Please call [phone] with any questions about today's appointment.  Please arrive at least 15 minutes prior to your scheduled appointments.   If you had blood work today, I will send you a MyChart message or a letter if results are normal. Otherwise, I will give you a call.   If you had a referral placed, they will call you to set up an appointment. Please give [place] a call if you don't hear back in the next 2 weeks.   If you need additional refills before your next appointment, please call your pharmacy first.   Shelley Morales, MD Family Medicine

## 2023-06-01 NOTE — Progress Notes (Signed)
Patients wet prep results came back after leaving the office- patient had positive trichomonas and BV. Called patient to explain results. Prescribed metronidazole. Patient expressed understanding. All questions answered.

## 2023-06-01 NOTE — Progress Notes (Signed)
    SUBJECTIVE:   CHIEF COMPLAINT / HPI:   Patient presents for STD testing. Dark yellow vaginal discharge for one week. Patient has not been sexually active for 2 months but would still like to be test. Patient denies any odor, itching, no fevers or sick symptoms and no abdominal tenderness. She denies any painful urination.  PERTINENT  PMH / PSH:  Hx of trich, chlamydia and syphilis  Hx of yeast infection, likely precipitated by DM II  DM II   OBJECTIVE:    BP 110/75   Pulse 85   Ht 5\' 2"  (1.575 m)   Wt 225 lb (102.1 kg)   LMP 05/31/2023   SpO2 100%   BMI 41.15 kg/m   General: A&O, NAD HEENT: No sign of trauma, Cardiac: RRR, no m/r/g Respiratory: CTAB, normal WOB, no w/c/r GI: Soft, NTTP, non-distended  Extremities: NTTP, no peripheral edema. Neuro: Normal gait, moves all four extremities appropriately. Psych: Appropriate mood and affect GU: Normal appearance of labia majora and minora, without lesions. Vagina tissue pink, moist, without lesions or abrasions. Cervix normal appearance, non-friable, with white discharge at os.   ASSESSMENT/PLAN:   Vaginal discharge Patient has 1 week hx of yellow vaginal discharge. Has not had any sex since September (2 months). Denies itching, burning, pain with urination, or malodor. Given patients history of STIs, there was conversation with PCP re PrEP. Patient has labs pending for w/u prior to initiation but states she is no longer with partner and would not like to pursue at this time. Gave guidance on safe sex practices and using condoms, ensuring partner and patient are both treated for infection.  - Wet Prep - G/C and trichomonas with Cervicovaginal swab   Healthcare Maintenance - received flu shot today  Hal Morales, MD [facility]

## 2023-06-03 LAB — CERVICOVAGINAL ANCILLARY ONLY
Chlamydia: NEGATIVE
Comment: NEGATIVE
Comment: NEGATIVE
Comment: NORMAL
Neisseria Gonorrhea: NEGATIVE
Trichomonas: POSITIVE — AB

## 2023-06-09 ENCOUNTER — Ambulatory Visit: Payer: BC Managed Care – PPO | Admitting: Pharmacist

## 2023-08-10 ENCOUNTER — Other Ambulatory Visit: Payer: Self-pay | Admitting: Student

## 2023-08-10 ENCOUNTER — Other Ambulatory Visit (HOSPITAL_COMMUNITY)
Admission: RE | Admit: 2023-08-10 | Discharge: 2023-08-10 | Disposition: A | Discharge status: Home / Self Care | Payer: BLUE CROSS/BLUE SHIELD | Source: Ambulatory Visit | Attending: Family Medicine | Admitting: Family Medicine

## 2023-08-10 ENCOUNTER — Ambulatory Visit (INDEPENDENT_AMBULATORY_CARE_PROVIDER_SITE_OTHER): Payer: BLUE CROSS/BLUE SHIELD | Admitting: Student

## 2023-08-10 DIAGNOSIS — E1165 Type 2 diabetes mellitus with hyperglycemia: Secondary | ICD-10-CM | POA: Diagnosis not present

## 2023-08-10 DIAGNOSIS — Z202 Contact with and (suspected) exposure to infections with a predominantly sexual mode of transmission: Secondary | ICD-10-CM | POA: Diagnosis present

## 2023-08-10 LAB — POCT WET PREP (WET MOUNT): Clue Cells Wet Prep Whiff POC: POSITIVE

## 2023-08-10 LAB — POCT GLYCOSYLATED HEMOGLOBIN (HGB A1C): HbA1c, POC (controlled diabetic range): 11.6 % — AB (ref 0.0–7.0)

## 2023-08-10 MED ORDER — SEMAGLUTIDE(0.25 OR 0.5MG/DOS) 2 MG/1.5ML ~~LOC~~ SOPN: 0.2500 mg | PEN_INJECTOR | SUBCUTANEOUS | 3 refills | Status: DC

## 2023-08-10 MED ORDER — METFORMIN HCL ER 500 MG PO TB24: 500.0000 mg | ORAL_TABLET | Freq: Two times a day (BID) | ORAL | 3 refills | Status: AC

## 2023-08-10 NOTE — Progress Notes (Signed)
 go   SUBJECTIVE:   CHIEF COMPLAINT / HPI:   STI check - Requests testing, last tested positive for trich 06/01/23 and adequately treated  - NILM pap 10/2022  - Medications tried: none  - Sexually active with 1 new partner  - Last sexual encounter: N/A - Contraception: Condoms  - LMP: 01/24 - Symptoms include: Abnormal vaginal discharge--no other symptoms but with thick vaginal discharge  -Does not currently have insurance   Type 2 Diabetes: Home medications include: Previously on Ozempic  and Metformin  with sglt2 but no longer 2/2 lack of insurance, off of meds for 2 months. Does not endorse compliance.   Most recent A1Cs:  Lab Results  Component Value Date   HGBA1C 11.6 (A) 08/10/2023   HGBA1C 12.1 (A) 12/07/2022   Last Microalbumin, LDL, Creatinine: Lab Results  Component Value Date   MICROALBUR 5.8 (H) 11/13/2014   LDLCALC 127 (H) 08/29/2017   CREATININE 0.51 (L) 11/23/2022      PERTINENT  PMH / PSH:  Hx of trich, chlamydia and syphilis  Hx of yeast infection, likely precipitated by DM II  DM II   OBJECTIVE:   BP 103/83   Pulse 98   Ht 5\' 2"  (1.575 m)   Wt 229 lb (103.9 kg)   LMP 07/28/2023   SpO2 100%   BMI 41.88 kg/m   General: NAD, pleasant, able to participate in exam Respiratory: No respiratory distress Skin: warm and dry, no rashes noted Psych: Normal affect and mood GU: Chaperone present, yellow discharge in vaginal canal, otherwise normal GU exam    ASSESSMENT/PLAN:   Assessment & Plan STD exposure Reviewed labs and allergies will check GC/Chlamydia, Trichomonas, RPR, and HIV and will call patient with results. Discussed safe sex practices. Will schedule pap if indicated - UTD on pap. Patient's questions answered to their satisfaction.  Hold of on labs required for Prep due to lack of insurance   Type 2 diabetes mellitus with hyperglycemia, without long-term current use of insulin  (HCC) poorly controlled 2/2 lack of insurance  - Last A1c:   Lab Results  Component Value Date   HGBA1C 11.6 (A) 08/10/2023   - Medications: Restart Ozempic  lowest dose and Metformin . Metformin  should be affordable through pharmacy and patient's new insurance should restart soon per patient. - Needs UACr, foot and eye exam  - Needs Koval appt, may need insulin  but has lack of insurance as a barrier - Instructed to call when she gets her insurance back to schedule with PCP and Koval - Call if unable to obtain meds       Ernestina Headland, MD [facility]

## 2023-08-10 NOTE — Patient Instructions (Addendum)
 I will call you with the results of your labs. Please practice safe sex and let us  know if you need anything.   Continue with condoms and call if you would like to discuss contraception   When you get your new insurance card, please schedule appt with Dr. Koval and you PCP for discussion of Prep   Local Resources:  [facility] [phone] [address] [place], [place] [address] ReportNation.uy  [facility] [phone] [address] [place], [place] [address]  [facility] [phone] [address], [place], [place] [address]  [place]: [address] [place], [place] [address] [phone]  [facility] [address] [place] [place], [place] [address]  [facility] [address] [place], [place] [address] [phone] Toll Free: [phone] [email or web address]

## 2023-08-10 NOTE — Assessment & Plan Note (Signed)
 poorly controlled 2/2 lack of insurance  - Last A1c:  Lab Results  Component Value Date   HGBA1C 11.6 (A) 08/10/2023   - Medications: Restart Ozempic  lowest dose and Metformin . Metformin  should be affordable through pharmacy and patient's new insurance should restart soon per patient. - Needs UACr, foot and eye exam  - Needs Koval appt, may need insulin  but has lack of insurance as a barrier - Instructed to call when she gets her insurance back to schedule with PCP and Koval - Call if unable to obtain meds

## 2023-08-11 ENCOUNTER — Other Ambulatory Visit: Payer: Self-pay | Admitting: Student

## 2023-08-11 LAB — CERVICOVAGINAL ANCILLARY ONLY
Chlamydia: NEGATIVE
Comment: NEGATIVE
Comment: NEGATIVE
Comment: NORMAL
Neisseria Gonorrhea: NEGATIVE
Trichomonas: POSITIVE — AB

## 2023-08-11 MED ORDER — METRONIDAZOLE 500 MG PO TABS: 500.0000 mg | ORAL_TABLET | Freq: Two times a day (BID) | ORAL | 0 refills | Status: DC

## 2023-08-11 MED ORDER — SEMAGLUTIDE(0.25 OR 0.5MG/DOS) 2 MG/3ML ~~LOC~~ SOPN: 0.5000 mg | PEN_INJECTOR | SUBCUTANEOUS | 2 refills | Status: DC

## 2023-08-12 LAB — RPR, QUANT+TP ABS (REFLEX)
Rapid Plasma Reagin, Quant: 1:4 {titer} — ABNORMAL HIGH
T Pallidum Abs: REACTIVE — AB

## 2023-08-12 LAB — HIV ANTIBODY (ROUTINE TESTING W REFLEX): HIV Screen 4th Generation wRfx: NONREACTIVE

## 2023-08-12 LAB — RPR: RPR Ser Ql: REACTIVE — AB

## 2023-08-15 ENCOUNTER — Telehealth: Payer: Self-pay

## 2023-08-15 ENCOUNTER — Other Ambulatory Visit (HOSPITAL_COMMUNITY): Payer: Self-pay

## 2023-08-15 NOTE — Telephone Encounter (Signed)
Pharmacy Patient Advocate Encounter   Received notification from CoverMyMeds that prior authorization for OZEMPIC 0.5MG  is required/requested.   Insurance verification completed.   The patient is insured through [employer] .   PA required; PA submitted to above mentioned insurance via CoverMyMeds Key/confirmation #/EOC [id number]. Status is pending

## 2023-08-16 NOTE — Telephone Encounter (Signed)
Pharmacy Patient Advocate Encounter  Received notification from [facility] that Prior Authorization for [facility] 0.25MG /0.5MG  has been APPROVED from 08/15/23 to 08/14/24

## 2023-08-24 ENCOUNTER — Ambulatory Visit (INDEPENDENT_AMBULATORY_CARE_PROVIDER_SITE_OTHER): Payer: BLUE CROSS/BLUE SHIELD | Admitting: Student

## 2023-08-24 ENCOUNTER — Encounter: Payer: Self-pay | Admitting: Student

## 2023-08-24 VITALS — BP 110/70 | HR 91 | Ht 62.0 in | Wt 229.0 lb

## 2023-08-24 DIAGNOSIS — B379 Candidiasis, unspecified: Secondary | ICD-10-CM | POA: Diagnosis not present

## 2023-08-24 DIAGNOSIS — A5901 Trichomonal vulvovaginitis: Secondary | ICD-10-CM | POA: Diagnosis not present

## 2023-08-24 DIAGNOSIS — N898 Other specified noninflammatory disorders of vagina: Secondary | ICD-10-CM | POA: Diagnosis not present

## 2023-08-24 LAB — POCT WET PREP (WET MOUNT): Clue Cells Wet Prep Whiff POC: POSITIVE

## 2023-08-24 MED ORDER — FLUCONAZOLE 150 MG PO TABS: 150.0000 mg | ORAL_TABLET | Freq: Once | ORAL | 1 refills | Status: AC

## 2023-08-24 MED ORDER — METRONIDAZOLE 500 MG PO TABS: 500.0000 mg | ORAL_TABLET | Freq: Two times a day (BID) | ORAL | 0 refills | Status: AC

## 2023-08-24 NOTE — Progress Notes (Signed)
    SUBJECTIVE:   CHIEF COMPLAINT / HPI:   Shelley Payne is a 28 y.o. female  presenting for vaginal irritation that occurred after completing treatment for BV and trichomonas.  Patient denies having unprotected intercourse since prior testing.  She reports having severe itchiness and irritation.  No fever, chills, increased urinary frequency, CVA tenderness/pain.  PERTINENT  PMH / PSH: Reviewed and updated   OBJECTIVE:   BP 110/70   Pulse 91   Ht 5\' 2"  (1.575 m)   Wt 229 lb (103.9 kg)   LMP 07/28/2023   SpO2 97%   BMI 41.88 kg/m   Well-appearing, no acute distress Cardio: Regular rate, regular rhythm, no murmurs on exam. Pulm: Clear, no wheezing, no crackles. No increased work of breathing Abdominal: bowel sounds present, soft, non-tender, non-distended Extremities: no peripheral edema   Pelvic Exam: MA chaperone present  Normal external genitalia Discharge: White, chunky No cervical motion tenderness  Cervix visualized with no lesions       08/24/2023    8:51 AM 08/10/2023    8:56 AM 03/09/2023    9:16 AM  PHQ9 SCORE ONLY  PHQ-9 Total Score 0 0 0      ASSESSMENT/PLAN:   No problem-specific Assessment & Plan notes found for this encounter.   Vaginal irritation/Yeast infection: Most likely consistent with yeast infection as patient recently completed antibiotics and has not had unprotected intercourse since prior STI testing.  Will empirically treat with fluconazole with 1 refill to be taken 72 hours if symptoms persist.  Wet prep completed today showing trichomonas, BV, yeast.  Called patient to discuss wet prep results and reeducated on treating current partners.  Patient will take 7-day course of metronidazole 500 mg twice daily and 1 dose of fluconazole.  Glendale Chard, DO [place] [facility]

## 2023-08-24 NOTE — Patient Instructions (Signed)
It was great to see you today!   For the vaginal irritation you most likely have yeast from the antibiotics. Take one pill, if your symptoms persist take the second pill 72 hours (3 days later).   No future appointments.  Please arrive 15 minutes before your appointment to ensure smooth check in process.    Please call the clinic at [phone] if your symptoms worsen or you have any concerns.  Thank you for allowing me to participate in your care, Dr. Glendale Chard [facility]

## 2023-09-05 ENCOUNTER — Encounter: Payer: Self-pay | Admitting: Student

## 2023-09-06 ENCOUNTER — Other Ambulatory Visit (HOSPITAL_COMMUNITY)
Admission: RE | Admit: 2023-09-06 | Discharge: 2023-09-06 | Disposition: A | Discharge status: Home / Self Care | Payer: BLUE CROSS/BLUE SHIELD | Source: Ambulatory Visit | Attending: Family Medicine | Admitting: Family Medicine

## 2023-09-06 ENCOUNTER — Ambulatory Visit: Payer: BLUE CROSS/BLUE SHIELD | Admitting: Student

## 2023-09-06 ENCOUNTER — Encounter: Payer: Self-pay | Admitting: Student

## 2023-09-06 VITALS — BP 100/70 | HR 98 | Ht 62.0 in | Wt 228.4 lb

## 2023-09-06 DIAGNOSIS — N898 Other specified noninflammatory disorders of vagina: Secondary | ICD-10-CM | POA: Insufficient documentation

## 2023-09-06 DIAGNOSIS — B379 Candidiasis, unspecified: Secondary | ICD-10-CM

## 2023-09-06 LAB — POCT WET PREP (WET MOUNT)
Clue Cells Wet Prep Whiff POC: NEGATIVE
Trichomonas Wet Prep HPF POC: ABSENT
WBC, Wet Prep HPF POC: >20

## 2023-09-06 MED ORDER — FLUCONAZOLE 150 MG PO TABS: 150.0000 mg | ORAL_TABLET | Freq: Once | ORAL | 1 refills | Status: AC

## 2023-09-06 NOTE — Progress Notes (Signed)
    SUBJECTIVE:   CHIEF COMPLAINT / HPI:   Shelley Payne is a 28 y.o. female  presenting for vaginal discharge  She has been treated over the course of the year for several infections of trichomonas. She reports recently ending a relationship with a long term partner. She has not had intercourse in over 1 month. She reports having vaginal itchiness and dryness.   PERTINENT  PMH / PSH: Reviewed and updated   OBJECTIVE:   BP 100/70   Pulse 98   Ht 5\' 2"  (1.575 m)   Wt 228 lb 6 oz (103.6 kg)   LMP 08/28/2023   SpO2 99%   BMI 41.77 kg/m   Well-appearing, no acute distress Cardio: Regular rate, regular rhythm, no murmurs on exam. Pulm: Clear, no wheezing, no crackles. No increased work of breathing Abdominal: bowel sounds present, soft, non-tender, non-distended   Pelvic Exam: MA chaperone present  Normal external genitalia Copious white thick discharge No cervical motion tenderness  Cervix visualized with no lesions       09/06/2023    9:08 AM 08/24/2023    8:51 AM 08/10/2023    8:56 AM  PHQ9 SCORE ONLY  PHQ-9 Total Score 0 0 0      ASSESSMENT/PLAN:   Yeast infection Wet prep and exam consistent with yeast infection. Negative for trichomonas. Educated on barrier method for STI prevention and partner treatment for STDs.       Glendale Chard, DO [place] [facility]

## 2023-09-06 NOTE — Assessment & Plan Note (Addendum)
Wet prep and exam consistent with yeast infection. Negative for trichomonas. Educated on barrier method for STI prevention and partner treatment for STDs.

## 2023-09-07 LAB — CERVICOVAGINAL ANCILLARY ONLY
Chlamydia: NEGATIVE
Comment: NEGATIVE
Comment: NORMAL
Neisseria Gonorrhea: NEGATIVE

## 2023-09-08 ENCOUNTER — Encounter: Payer: Self-pay | Admitting: Student

## 2023-09-08 DIAGNOSIS — B379 Candidiasis, unspecified: Secondary | ICD-10-CM

## 2023-09-08 MED ORDER — FLUCONAZOLE 150 MG PO TABS: 150.0000 mg | ORAL_TABLET | Freq: Once | ORAL | 0 refills | Status: AC

## 2023-09-13 ENCOUNTER — Ambulatory Visit (INDEPENDENT_AMBULATORY_CARE_PROVIDER_SITE_OTHER): Payer: BLUE CROSS/BLUE SHIELD | Admitting: Student

## 2023-09-13 VITALS — BP 100/72 | HR 103 | Ht 62.0 in | Wt 226.6 lb

## 2023-09-13 DIAGNOSIS — N898 Other specified noninflammatory disorders of vagina: Secondary | ICD-10-CM

## 2023-09-13 LAB — POCT WET PREP (WET MOUNT): Clue Cells Wet Prep Whiff POC: POSITIVE

## 2023-09-13 MED ORDER — FLUCONAZOLE 150 MG PO TABS: 150.0000 mg | ORAL_TABLET | Freq: Once | ORAL | 0 refills | Status: DC

## 2023-09-13 MED ORDER — TINIDAZOLE 500 MG PO TABS: 2.0000 g | ORAL_TABLET | Freq: Every day | ORAL | 0 refills | Status: DC

## 2023-09-13 NOTE — Progress Notes (Signed)
    SUBJECTIVE:   CHIEF COMPLAINT / HPI:   Shelley Payne is a 28 y.o. female  presenting for concerns for vaginal discharge.  Patient is worried that she now has yellow to brown discolored discharge with an odor.  She was previously treated for trichomonas and test of cure was negative.  She was also treated for a yeast infection and she reports that irritation and burning has resolved.  Denies being sexually active and declines STI testing today.  PERTINENT  PMH / PSH: Reviewed and updated   OBJECTIVE:   BP 100/72   Pulse (!) 103   Ht 5\' 2"  (1.575 m)   Wt 226 lb 9.6 oz (102.8 kg)   LMP 08/28/2023   SpO2 98%   BMI 41.45 kg/m   Well-appearing, no acute distress Cardio: Regular rate, regular rhythm, no murmurs on exam. Pulm: Clear, no wheezing, no crackles. No increased work of breathing Abdominal: bowel sounds present, soft, non-tender, non-distended Extremities: no peripheral edema   Pelvic Exam: MA chaperone present  Normal external genitalia Discharge: thick, yellow, malodorous No cervical motion tenderness  Cervix visualized with no lesions       09/06/2023    9:08 AM 08/24/2023    8:51 AM 08/10/2023    8:56 AM  PHQ9 SCORE ONLY  PHQ-9 Total Score 0 0 0      ASSESSMENT/PLAN:   Vaginal discharge: Patient declined STI testing today.  Wet prep collected which tested positive for trichomonas, BV, yeast.  As patient has been treated multiple times for BV and trichomonas will elect to treat with a one-time dose of Tinidazole 2 g. Called patient to discuss results.   Glendale Chard, DO [place] [facility]

## 2023-09-13 NOTE — Patient Instructions (Signed)
 It was great to see you today!   I will call you with results.   No future appointments.  Please arrive 15 minutes before your appointment to ensure smooth check in process.    Please call the clinic at [phone] if your symptoms worsen or you have any concerns.  Thank you for allowing me to participate in your care, Dr. Glendale Chard [facility]

## 2023-10-25 ENCOUNTER — Other Ambulatory Visit (HOSPITAL_COMMUNITY)
Admission: RE | Admit: 2023-10-25 | Discharge: 2023-10-25 | Disposition: A | Discharge status: Home / Self Care | Payer: Self-pay | Source: Ambulatory Visit | Attending: Family Medicine | Admitting: Family Medicine

## 2023-10-25 ENCOUNTER — Ambulatory Visit (INDEPENDENT_AMBULATORY_CARE_PROVIDER_SITE_OTHER): Payer: Self-pay | Admitting: Student

## 2023-10-25 VITALS — BP 122/86 | HR 89 | Ht 62.0 in | Wt 224.1 lb

## 2023-10-25 DIAGNOSIS — Z202 Contact with and (suspected) exposure to infections with a predominantly sexual mode of transmission: Secondary | ICD-10-CM | POA: Diagnosis present

## 2023-10-25 DIAGNOSIS — N898 Other specified noninflammatory disorders of vagina: Secondary | ICD-10-CM

## 2023-10-25 LAB — POCT WET PREP (WET MOUNT)
Clue Cells Wet Prep Whiff POC: POSITIVE
Trichomonas Wet Prep HPF POC: ABSENT

## 2023-10-25 MED ORDER — TINIDAZOLE 500 MG PO TABS: 2.0000 g | ORAL_TABLET | Freq: Every day | ORAL | 0 refills | Status: AC

## 2023-10-25 MED ORDER — FLUCONAZOLE 150 MG PO TABS: 150.0000 mg | ORAL_TABLET | Freq: Once | ORAL | 0 refills | Status: AC

## 2023-10-25 NOTE — Progress Notes (Signed)
    SUBJECTIVE:   CHIEF COMPLAINT / HPI:   STI Check:  - Treated previously with Tinidazole for Trich and BV  The patient, with a history of recurrent bacterial vaginosis (BV), presents with vaginal discharge and abnormal periods. She reports having two periods in one month, which is unusual for her. She also experienced slight bleeding but denies any associated pain, itching, or rash. The patient is not currently on any form of birth control and is not seeking to start any. She mentions a recent new sexual partner since her last treatment for BV. The patient's symptoms of BV had previously resolved with treatment but have since returned.  PERTINENT  PMH / PSH: Reviewed   OBJECTIVE:   BP 122/86   Pulse 89   Ht 5\' 2"  (1.575 m)   Wt 224 lb 2 oz (101.7 kg)   LMP 10/16/2023   SpO2 100%   BMI 40.99 kg/m   General: NAD, pleasant, able to participate in exam Respiratory: No respiratory distress Skin: warm and dry, no rashes noted Psych: Normal affect and mood GU: Vagina normal, no rashes evident or ulcerations. White discharge noted in vaginal vault  Chaperone present    ASSESSMENT/PLAN:   Assessment & Plan Possible exposure to STI Recurrent vaginal discharge with bacterial vaginosis (wet prep with BV and yeast). No associated pain, itching, or rash. Slight abnormal bleeding with two periods in one month. Previous tinidazole treatment was effective, but symptoms recurred after a new sexual partner. Tinidazole, which can cause nausea, was well-tolerated previously. Repeating tinidazole is appropriate, with prophylactic antifungal treatment to prevent yeast infection post-antibiotic treatment. - Prescribe tinidazole for recurrent bacterial vaginosis - Prescribe antifungal medication to prevent yeast infection post-antibiotic treatment     Shelley Martinez, MD [facility]

## 2023-10-25 NOTE — Patient Instructions (Addendum)
 It was great to see you today! Thank you for choosing [facility] for your primary care.  Today we addressed: Will treat again with the medication for BV and for yeast  Continue using condoms  Call if symptoms do not go away   If you haven't already, sign up for My Chart to have easy access to your labs results, and communication with your primary care physician.   Please arrive 15 minutes before your appointment to ensure smooth check in process.  We appreciate your efforts in making this happen.  Thank you for allowing me to participate in your care, Alfredo Martinez, MD 10/25/2023, 10:27 AM PGY-3, [facility]

## 2023-10-26 LAB — CERVICOVAGINAL ANCILLARY ONLY
Chlamydia: NEGATIVE
Comment: NEGATIVE
Comment: NEGATIVE
Comment: NORMAL
Neisseria Gonorrhea: NEGATIVE
Trichomonas: NEGATIVE

## 2023-10-27 ENCOUNTER — Encounter: Payer: Self-pay | Admitting: Student

## 2023-11-07 ENCOUNTER — Ambulatory Visit: Payer: Self-pay | Admitting: Family Medicine

## 2023-11-07 ENCOUNTER — Telehealth: Payer: Self-pay | Admitting: Pharmacist

## 2023-11-07 ENCOUNTER — Encounter: Payer: Self-pay | Admitting: Family Medicine

## 2023-11-07 ENCOUNTER — Other Ambulatory Visit (HOSPITAL_COMMUNITY)
Admission: RE | Admit: 2023-11-07 | Discharge: 2023-11-07 | Disposition: A | Discharge status: Home / Self Care | Payer: Self-pay | Source: Ambulatory Visit | Attending: Family Medicine | Admitting: Family Medicine

## 2023-11-07 VITALS — BP 112/76 | HR 96 | Ht 62.0 in | Wt 221.0 lb

## 2023-11-07 DIAGNOSIS — N898 Other specified noninflammatory disorders of vagina: Secondary | ICD-10-CM | POA: Insufficient documentation

## 2023-11-07 DIAGNOSIS — E1365 Other specified diabetes mellitus with hyperglycemia: Secondary | ICD-10-CM

## 2023-11-07 LAB — POCT URINE PREGNANCY: Preg Test, Ur: NEGATIVE

## 2023-11-07 LAB — POCT GLYCOSYLATED HEMOGLOBIN (HGB A1C): HbA1c, POC (controlled diabetic range): 11.8 % — AB (ref 0.0–7.0)

## 2023-11-07 NOTE — Assessment & Plan Note (Addendum)
 Differentials include yeast infection, BV, STD.  History of recurrent BV and yeast infections.  Urine pregnancy negative. UTD on pap.  - Test for BV, yeast, gonorrhea, chlamydia, trichomoniasis performed today - Will treat accordingly, if patient has BV again consider twice weekly MetroGel indefinitely

## 2023-11-07 NOTE — Patient Instructions (Signed)
 Good to see you today - Thank you for coming in  Things we discussed today: We are testing for pregnancy and STDs today.  I will let you know via MyChart with the results are.  We are also testing for yeast and BV, depending on the results I will treat you accordingly. I am checking your A1c today.  Please make an appointment in 2 to 4 weeks to just discuss your diabetes.  We may need to start you on a medication for your diabetes before you get the Ozempic coverage back. Make sure you are eating a diet full of fiber, vegetables, and be mindful of your carbohydrate intake.   Please always bring your medication bottles

## 2023-11-07 NOTE — Progress Notes (Signed)
    SUBJECTIVE:   CHIEF COMPLAINT / HPI:   VAGINAL DISCHARGE  Having vaginal discharge for 1 week and odor Discharge consistency: thin Discharge color: yellow  Recent antibiotic use: tinidazole, diflucan for BV and empirically for yeast. Has hx of recurrent BV  Possible STD exposure: Two new sexual partners, unprotected intercourse with one partner  No fever, dysuria, vaginal bleeding, abdominal pain, pelvic pain, back pain, genital sores, rashes, pain with intercourse  LMP 10/16/23   PERTINENT  PMH / PSH: T2DM  OBJECTIVE:   BP 112/76   Pulse 96   Ht 5\' 2"  (1.575 m)   Wt 221 lb (100.2 kg)   LMP 10/16/2023   SpO2 96%   BMI 40.42 kg/m   Pelvic - VULVA: normal appearing vulva with no masses, tenderness or lesions  VAGINA: normal appearing vagina with normal color and discharge, no lesions  CERVIX: normal appearing cervix without lesions, no CMT, thick white discharge present  ASSESSMENT/PLAN:   Assessment & Plan Vaginal discharge Differentials include yeast infection, BV, STD.  History of recurrent BV and yeast infections.  Urine pregnancy negative. UTD on pap.  - Test for BV, yeast, gonorrhea, chlamydia, trichomoniasis performed today - Will treat accordingly, if patient has BV again consider twice weekly MetroGel indefinitely Uncontrolled other specified diabetes mellitus with hyperglycemia (HCC) A1c 11.6 07/2023.  Repeat A1c today 11.8.  Patient states she has been unable to take Ozempic because she lost her insurance.  She is currently only taking metformin. - Advised to make appointment in 2 to 4 weeks PCP solely to discuss diabetes management - Would need foot and eye exam    Shelley Cumins, DO [place] [facility] Center

## 2023-11-07 NOTE — Telephone Encounter (Signed)
 Patient contacted for potential CGM initiation for LIBERATE study. Patient reported not interested in enrolling in study.  Medication Plan: -no change    Total time with patient call and documentation of interaction: 4 minutes.

## 2023-11-09 ENCOUNTER — Telehealth: Payer: Self-pay | Admitting: Family Medicine

## 2023-11-09 LAB — RPR, QUANT+TP ABS (REFLEX): Rapid Plasma Reagin, Quant: 1:8 {titer} — ABNORMAL HIGH

## 2023-11-09 LAB — RPR: RPR Ser Ql: REACTIVE — AB

## 2023-11-09 LAB — HIV ANTIBODY (ROUTINE TESTING W REFLEX): HIV Screen 4th Generation wRfx: NONREACTIVE

## 2023-11-09 NOTE — Telephone Encounter (Signed)
 Spoke with pt via phone, she confirmed DOB. RPR reactive but hx of prior treated infection. However titer increased from 1:4 to 1:8. Spoke with ID who recommended treatment if pt has been recently sexually active although titer level is not significantly high.  Pt would like office visit for treatment, scheduled with Randeen Busman on 4/21. Anticipate partner will need treatment as well. All questions answered.

## 2023-11-09 NOTE — Progress Notes (Signed)
 Titer increased from 1:4 to 1:8. Spoke with ID Dr. Manandar who recommended treatment since pt has been recently sexually active. Spoke with pt who would like appointment 4/21 with Dr. Gomes, will give treatment at that time.

## 2023-11-10 ENCOUNTER — Encounter: Payer: Self-pay | Admitting: Family Medicine

## 2023-11-10 ENCOUNTER — Other Ambulatory Visit: Payer: Self-pay | Admitting: Family Medicine

## 2023-11-10 LAB — CERVICOVAGINAL ANCILLARY ONLY
Bacterial Vaginitis (gardnerella): POSITIVE — AB
Candida Glabrata: POSITIVE — AB
Candida Vaginitis: POSITIVE — AB
Chlamydia: NEGATIVE
Comment: NEGATIVE
Comment: NEGATIVE
Comment: NEGATIVE
Comment: NEGATIVE
Comment: NEGATIVE
Comment: NORMAL
Neisseria Gonorrhea: NEGATIVE
Trichomonas: NEGATIVE

## 2023-11-10 MED ORDER — METRONIDAZOLE 500 MG PO TABS: 500.0000 mg | ORAL_TABLET | Freq: Two times a day (BID) | ORAL | 0 refills | Status: AC

## 2023-11-10 MED ORDER — BORIC ACID 600 MG VA SUPP: 600.0000 mg | Freq: Every day | VAGINAL | 0 refills | Status: DC

## 2023-11-10 NOTE — Progress Notes (Signed)
 Patient called.  Patient aware. Will treat with intravaginal boric acid 600mg  daily x2 weeks and metronidazole 500mg  BID x7 days. REFRAIN FROM SEXUAL ACTIVITY UNTIL TREATMENT COMPLETE

## 2023-11-10 NOTE — Progress Notes (Addendum)
   SUBJECTIVE:   CHIEF COMPLAINT / HPI: Positive STD screening - tx and discussion  RPR titer increased from previously, and infection suspected due to patient's history of recent sexual activity.  She comes in today to get treatment.  Patient also tested positive for BV, Candida vaginalis and Candida glabrata. Treatment with intravaginal boric acid 600mg  daily x2 weeks and metronidazole  500mg  BID x7 days ordered. Patient has started this course of treatment.  Last period on 03/23, should be getting her period soon as she has been having PMS. Last sexual encounter was on 4/6. Has not had sexual intercourse since receiving her results. Not on birth control, but uses condoms for protection. Would like to be pregnant in the near future, so denies birth control at this time.   PERTINENT  PMH / PSH: STD hx, T2DM  OBJECTIVE:   BP 114/86   Pulse 89   Ht 5\' 2"  (1.575 m)   Wt 223 lb 12.8 oz (101.5 kg)   LMP 10/16/2023   SpO2 100%   BMI 40.93 kg/m   General: Awake and Alert in NAD HEENT: NCAT. Sclera anicteric. No rhinorrhea. No oral lesions noted. Cardiovascular: RRR. No M/R/G Respiratory: CTAB, normal WOB on RA. No wheezing, crackles, rhonchi, or diminished breath sounds. Abdomen: Soft, non-tender, non-distended. Bowel sounds normoactive Extremities: Able to move all extremities. No BLE edema, no deformities or significant joint findings. Skin: Warm and dry. No abrasions or rashes noted. Neuro: No focal neurological deficits.  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 Becki Bouton discussed the results with ID (Dr Jonny Neu) who recommended treatment since the patient has been sexually active recently.  - Penicillin  2.4 million units today Counseling for sexually transmitted disease STD and safe sex counseling discussed and information provided in AVS   Clyda Dark, DO [facility]

## 2023-11-14 ENCOUNTER — Encounter: Payer: Self-pay | Admitting: Family Medicine

## 2023-11-14 ENCOUNTER — Ambulatory Visit: Payer: Self-pay | Admitting: Family Medicine

## 2023-11-14 VITALS — BP 114/86 | HR 89 | Ht 62.0 in | Wt 223.8 lb

## 2023-11-14 DIAGNOSIS — A539 Syphilis, unspecified: Secondary | ICD-10-CM

## 2023-11-14 DIAGNOSIS — Z708 Other sex counseling: Secondary | ICD-10-CM

## 2023-11-14 MED ORDER — PENICILLIN G BENZATHINE 600000 UNIT/ML IM SUSY
1200000.0000 [IU] | PREFILLED_SYRINGE | Freq: Once | INTRAMUSCULAR | Status: AC
Start: 2023-11-14 — End: 2023-11-14
  Administered 2023-11-14: 1200000 [IU] via INTRAMUSCULAR

## 2023-11-14 MED ORDER — PENICILLIN G BENZATHINE 600000 UNIT/ML IM SUSY
1200000.0000 [IU] | PREFILLED_SYRINGE | Freq: Once | INTRAMUSCULAR | Status: AC
  Administered 2023-11-14: 1200000 [IU] via INTRAMUSCULAR

## 2023-11-14 MED ORDER — PENICILLIN G BENZATHINE 600000 UNIT/ML IM SUSY: 2400000.0000 [IU] | PREFILLED_SYRINGE | Freq: Once | INTRAMUSCULAR | Status: DC

## 2023-11-14 NOTE — Patient Instructions (Addendum)
 It was great to see you today! Thank you for choosing [facility] for your primary care. Shelley Payne was seen for syphilis treatment.  Today we addressed: Syphilis treatment - Pencillin shot today Please call if you notice a rash, shortness of breath, or hives after receiving the shot.  Counseling provided on STD and BC options  STD and Safe Sex Practices - Important to note: - STDs can occur without symptoms - "Pulling out" is not an effective form of protection from pregnancy or STDs - Oral sex can transmit STDs too - Abstinence or being in a long-term, mutually monogamous relationship with an uninfected partner is the most reliable way to prevent STDs - Correct and consistent use of condoms can significantly reduce the risk of STDs - Limiting the number of sex partners and modifying other risky behaviors can decrease the risk of STDs - HPV vaccination is recommended for females up to age 58 and males up to age 64 (24 for men who have sex with men) - HIV testing is offered to anyone diagnosed with an STD, and pre-exposure prophylaxis may be appropriate for sexually active individuals. Post exposure prophylaxis is recommended within 72 hours of potential exposure - Please notify sexual partners if diagnosed with an STD. Expedited Partner Therapy (EPT) may be available for some infections  You should return to our clinic No follow-ups on file. Please arrive 15 minutes before your appointment to ensure smooth check in process.  We appreciate your efforts in making this happen.  Thank you for allowing me to participate in your care, Clyda Dark, DO 11/14/2023, 4:38 PM PGY-1, [facility]

## 2023-12-02 ENCOUNTER — Ambulatory Visit: Payer: Self-pay

## 2023-12-05 ENCOUNTER — Ambulatory Visit: Payer: Self-pay

## 2023-12-05 ENCOUNTER — Other Ambulatory Visit (HOSPITAL_COMMUNITY)
Admission: RE | Admit: 2023-12-05 | Discharge: 2023-12-05 | Disposition: A | Discharge status: Home / Self Care | Source: Ambulatory Visit | Attending: Family Medicine | Admitting: Family Medicine

## 2023-12-05 ENCOUNTER — Ambulatory Visit: Admitting: Student

## 2023-12-05 VITALS — BP 108/64 | HR 87 | Wt 223.8 lb

## 2023-12-05 DIAGNOSIS — N898 Other specified noninflammatory disorders of vagina: Secondary | ICD-10-CM | POA: Diagnosis present

## 2023-12-05 LAB — POCT URINALYSIS DIP (MANUAL ENTRY)
Bilirubin, UA: NEGATIVE
Glucose, UA: 500 mg/dL — AB
Nitrite, UA: NEGATIVE
Protein Ur, POC: 30 mg/dL — AB
Spec Grav, UA: >=1.03 — AB (ref 1.010–1.025)
Urobilinogen, UA: 0.2 U/dL
pH, UA: 5.5 (ref 5.0–8.0)

## 2023-12-05 LAB — POCT UA - MICROSCOPIC ONLY: RBC, Urine, Miroscopic: NONE SEEN (ref 0–2)

## 2023-12-05 NOTE — Patient Instructions (Addendum)
 It was great to see you today!   I will send you a message with your results.   No future appointments.  Please arrive 15 minutes before your appointment to ensure smooth check in process.    Please call the clinic at [phone] if your symptoms worsen or you have any concerns.  Thank you for allowing me to participate in your care, Dr. Clem Currier [facility]

## 2023-12-05 NOTE — Progress Notes (Unsigned)
    SUBJECTIVE:   CHIEF COMPLAINT / HPI:   Shelley Payne is a 28 y.o. female  presenting for vaginal discharge.   Vaginal Discharge/STI check - recently became sexually active with a new partner, wants to be checked for STIs.  - preferred gender of partner: male - Last sexual encounter: one week ago - Contraception: none Symptoms include: Abnormal vaginal discharge and Foul odor  PERTINENT  PMH / PSH: Reviewed and updated   OBJECTIVE:   There were no vitals taken for this visit.  well-appearing, no acute distress Cardio: Regular rate, regular rhythm, no murmurs on exam. Pulm: Clear, no wheezing, no crackles. No increased work of breathing Abdominal: bowel sounds present, soft, non-tender, non-distended Extremities: no peripheral edema   Pelvic Exam: MA chaperone present  Normal external genitalia Discharge: thick white-green  No cervical motion tenderness  Cervix visualized with no lesions       11/14/2023    4:15 PM 10/25/2023    9:54 AM 09/06/2023    9:08 AM  PHQ9 SCORE ONLY  PHQ-9 Total Score 0 0 0      ASSESSMENT/PLAN:   Assessment & Plan Vaginal discharge GC testing collected.  HIV ordered. RPR held due to patient recently being treated on 11/05/23. Will need to get another RPR in July to confirm downtrending RPR Birthcontrol and safe sex practices discussed with patient.  PREP discussed and offered to patient.     Clem Currier, DO [place] [facility] Center

## 2023-12-05 NOTE — Assessment & Plan Note (Signed)
 GC testing collected.  HIV ordered. RPR held due to patient recently being treated on 11/05/23. Will need to get another RPR in July to confirm downtrending RPR Birthcontrol and safe sex practices discussed with patient.  PREP discussed and offered to patient.  UA collected and not showing overt signs of infection.  Will await GC results. Long conversation today about patient's overall wellbeing and health she continues to come in for STIs.  She is connected with a therapist and is seeing them twice weekly.  Counseled patient extensively on using condoms for every sexual encounter.  Also provided birth control information.

## 2023-12-06 LAB — HIV ANTIBODY (ROUTINE TESTING W REFLEX): HIV Screen 4th Generation wRfx: NONREACTIVE

## 2023-12-07 LAB — CERVICOVAGINAL ANCILLARY ONLY
Bacterial Vaginitis (gardnerella): POSITIVE — AB
Candida Glabrata: NEGATIVE
Candida Vaginitis: POSITIVE — AB
Chlamydia: NEGATIVE
Comment: NEGATIVE
Comment: NEGATIVE
Comment: NEGATIVE
Comment: NEGATIVE
Comment: NEGATIVE
Comment: NORMAL
Neisseria Gonorrhea: NEGATIVE
Trichomonas: NEGATIVE

## 2023-12-08 ENCOUNTER — Ambulatory Visit: Payer: Self-pay | Admitting: Student

## 2023-12-08 DIAGNOSIS — N3000 Acute cystitis without hematuria: Secondary | ICD-10-CM

## 2023-12-08 DIAGNOSIS — B3731 Acute candidiasis of vulva and vagina: Secondary | ICD-10-CM

## 2023-12-08 DIAGNOSIS — N898 Other specified noninflammatory disorders of vagina: Secondary | ICD-10-CM

## 2023-12-08 MED ORDER — FLUCONAZOLE 150 MG PO TABS: 150.0000 mg | ORAL_TABLET | Freq: Once | ORAL | 0 refills | Status: AC

## 2023-12-08 MED ORDER — METRONIDAZOLE 500 MG PO TABS: 500.0000 mg | ORAL_TABLET | Freq: Three times a day (TID) | ORAL | 0 refills | Status: DC

## 2023-12-10 MED ORDER — CEPHALEXIN 500 MG PO CAPS
500.0000 mg | ORAL_CAPSULE | Freq: Two times a day (BID) | ORAL | 0 refills | Status: AC
Start: 2023-12-10 — End: 2023-12-15

## 2023-12-10 NOTE — Telephone Encounter (Signed)
 Antibiotic for UTI sent to pharmacy. Will treat with Keflex  500 mg BID for 5 days. Patient meeting criteria for UTI and UA confirms   Clem Currier, DO [facility], PGY-2 12/10/23 12:45 PM

## 2023-12-10 NOTE — Addendum Note (Signed)
 Addended by: Clem Currier on: 12/10/2023 12:45 PM   Modules accepted: Orders

## 2024-01-03 ENCOUNTER — Encounter: Payer: Self-pay | Admitting: *Deleted

## 2024-03-12 ENCOUNTER — Telehealth: Payer: Self-pay | Admitting: Pharmacist

## 2024-03-12 NOTE — Telephone Encounter (Signed)
 Patient contacted for follow-up of DM management to schedule appointment.  Patient unavailable, left VM and provided return phone number.  Total time with patient call and documentation of interaction: 2 minutes.

## 2024-04-09 ENCOUNTER — Telehealth: Payer: Self-pay | Admitting: Pharmacist

## 2024-04-09 NOTE — Telephone Encounter (Signed)
 Attempted to contact patient for potential enrollment in Liberate study  Left HIPAA compliant voice mail requesting call back to direct phone: [phone]  Total time with patient call and documentation of interaction: 4 minutes.  Follow-up phone call planned: None planned.

## 2024-04-19 ENCOUNTER — Encounter: Payer: Self-pay | Admitting: Family Medicine

## 2024-05-11 ENCOUNTER — Ambulatory Visit: Admitting: Family Medicine

## 2024-05-11 NOTE — Progress Notes (Deleted)
    SUBJECTIVE:   CHIEF COMPLAINT / HPI:   T2DM Last A1c 11.8 in April. She is prescribed metformin  500 mg BID, ozempic  0.5 mg. She is taking without issue.  ***  Hypothyroid TSH last checked in 2023 WNL. She is not taking any medications for this.  ***  PERTINENT  PMH / PSH: Reviewed. HTN, Hypothyroid  OBJECTIVE:   There were no vitals taken for this visit.  General: ***-appearing, no acute distress. HEENT: normocephalic, PERRLA, EOM grossly intact, MMM, bilateral TM visualized without erythema or bulging. Cardio: Regular rate, *** rhythm, no murmurs on exam. Pulm: Clear, no wheezing, no crackles. No increased work of breathing. Abdominal: bowel sounds present, soft, non-tender, non-distended. No HSM. Extremities: no peripheral edema. Moves all extremities equally. Neuro: Alert and oriented x3, speech normal in content, no facial asymmetry, strength intact and equal bilaterally in UE and LE, pupils equal and reactive to light.  Psych:  Cognition and judgment appear intact. Alert, communicative, and cooperative with normal attention span and concentration. No apparent delusions, illusions, hallucinations    ASSESSMENT/PLAN:   Assessment & Plan      Lauraine Norse, DO [facility] Center

## 2024-07-06 ENCOUNTER — Ambulatory Visit: Admitting: Family Medicine

## 2024-07-06 NOTE — Progress Notes (Deleted)
° ° °  SUBJECTIVE:   CHIEF COMPLAINT / HPI:   T2DM Prescribed metformin  500 mg BID, ozempic  0.5 mg weekly. She is taking this without issue, does not miss doses ***. Diet:   Last A1c April 2025 uncontrolled at 11.8.   STD testing Currently sexually active Uses BC: Menstrual cycle: Symptoms: ***  PERTINENT  PMH / PSH: Reviewed HTN, obesity  OBJECTIVE:   There were no vitals taken for this visit.  General: ***-appearing, no acute distress. HEENT: normocephalic, PERRLA, EOM grossly intact, MMM, bilateral TM visualized without erythema or bulging. Cardio: Regular rate, *** rhythm, no murmurs on exam. Pulm: Clear, no wheezing, no crackles. No increased work of breathing. Abdominal: bowel sounds present, soft, non-tender, non-distended. No HSM. Extremities: no peripheral edema. Moves all extremities equally. Neuro: Alert and oriented x3, speech normal in content, no facial asymmetry, strength intact and equal bilaterally in UE and LE, pupils equal and reactive to light.  Psych:  Cognition and judgment appear intact. Alert, communicative, and cooperative with normal attention span and concentration. No apparent delusions, illusions, hallucinations   FOOT EXAM  ASSESSMENT/PLAN:   Assessment & Plan Type 2 diabetes mellitus with hyperglycemia, without long-term current use of insulin  (HCC)  Primary hypertension  Screen for STD (sexually transmitted disease)      Lauraine Norse, DO [facility] Medicine Center

## 2024-07-11 ENCOUNTER — Ambulatory Visit: Admitting: Family Medicine

## 2024-07-11 NOTE — Progress Notes (Deleted)
° ° °  SUBJECTIVE:   CHIEF COMPLAINT / HPI:   T2DM Prescribed metformin  500 mg BID, ozempic  0.5 mg weekly. She is taking this without issue, does not miss doses ***. Diet:   Last A1c April 2025 uncontrolled at 11.8.   STD testing Currently sexually active Uses BC: Menstrual cycle: Symptoms: ***  PERTINENT  PMH / PSH: Reviewed HTN, obesity  OBJECTIVE:   There were no vitals taken for this visit.  General: ***-appearing, no acute distress. HEENT: normocephalic, PERRLA, EOM grossly intact, MMM, bilateral TM visualized without erythema or bulging. Cardio: Regular rate, *** rhythm, no murmurs on exam. Pulm: Clear, no wheezing, no crackles. No increased work of breathing. Abdominal: bowel sounds present, soft, non-tender, non-distended. No HSM. Extremities: no peripheral edema. Moves all extremities equally. Neuro: Alert and oriented x3, speech normal in content, no facial asymmetry, strength intact and equal bilaterally in UE and LE, pupils equal and reactive to light.  Psych:  Cognition and judgment appear intact. Alert, communicative, and cooperative with normal attention span and concentration. No apparent delusions, illusions, hallucinations   FOOT EXAM  ASSESSMENT/PLAN:   Assessment & Plan      Lauraine Norse, DO [facility]

## 2024-08-03 ENCOUNTER — Other Ambulatory Visit (HOSPITAL_COMMUNITY): Admission: RE | Admit: 2024-08-03 | Discharge: 2024-08-03 | Disposition: A | Source: Ambulatory Visit

## 2024-08-03 ENCOUNTER — Encounter: Payer: Self-pay | Admitting: Family Medicine

## 2024-08-03 ENCOUNTER — Ambulatory Visit: Admitting: Family Medicine

## 2024-08-03 VITALS — BP 128/87 | HR 86 | Ht 62.0 in | Wt 228.4 lb

## 2024-08-03 DIAGNOSIS — E559 Vitamin D deficiency, unspecified: Secondary | ICD-10-CM | POA: Diagnosis not present

## 2024-08-03 DIAGNOSIS — D649 Anemia, unspecified: Secondary | ICD-10-CM

## 2024-08-03 DIAGNOSIS — Z23 Encounter for immunization: Secondary | ICD-10-CM | POA: Diagnosis not present

## 2024-08-03 DIAGNOSIS — E063 Autoimmune thyroiditis: Secondary | ICD-10-CM

## 2024-08-03 DIAGNOSIS — N3000 Acute cystitis without hematuria: Secondary | ICD-10-CM

## 2024-08-03 DIAGNOSIS — E1165 Type 2 diabetes mellitus with hyperglycemia: Secondary | ICD-10-CM

## 2024-08-03 DIAGNOSIS — B353 Tinea pedis: Secondary | ICD-10-CM | POA: Diagnosis not present

## 2024-08-03 DIAGNOSIS — Z113 Encounter for screening for infections with a predominantly sexual mode of transmission: Secondary | ICD-10-CM | POA: Diagnosis present

## 2024-08-03 DIAGNOSIS — I10 Essential (primary) hypertension: Secondary | ICD-10-CM

## 2024-08-03 LAB — POCT GLYCOSYLATED HEMOGLOBIN (HGB A1C): HbA1c, POC (controlled diabetic range): 12.2 % — AB (ref 0.0–7.0)

## 2024-08-03 LAB — POCT UA - MICROSCOPIC ONLY: RBC, Urine, Miroscopic: NONE SEEN

## 2024-08-03 LAB — POCT URINALYSIS DIP (MANUAL ENTRY)
Bilirubin, UA: NEGATIVE
Blood, UA: NEGATIVE
Glucose, UA: >=1000 mg/dL — AB
Ketones, POC UA: NEGATIVE mg/dL
Leukocytes, UA: NEGATIVE
Nitrite, UA: POSITIVE — AB
Protein Ur, POC: NEGATIVE mg/dL
Spec Grav, UA: 1.025
Urobilinogen, UA: 0.2 U/dL
pH, UA: 5.5

## 2024-08-03 LAB — POCT WET PREP (WET MOUNT)
Clue Cells Wet Prep Whiff POC: POSITIVE
Trichomonas Wet Prep HPF POC: ABSENT
WBC, Wet Prep HPF POC: >20

## 2024-08-03 MED ORDER — MOUNJARO 2.5 MG/0.5ML ~~LOC~~ SOAJ: 2.5000 mg | SUBCUTANEOUS | 0 refills | Status: AC

## 2024-08-03 MED ORDER — ATORVASTATIN CALCIUM 40 MG PO TABS: 40.0000 mg | ORAL_TABLET | Freq: Every day | ORAL | 3 refills | Status: AC

## 2024-08-03 MED ORDER — METRONIDAZOLE 0.75 % VA GEL: 1.0000 | Freq: Every day | VAGINAL | 0 refills | Status: AC

## 2024-08-03 MED ORDER — CEPHALEXIN 500 MG PO CAPS: 500.0000 mg | ORAL_CAPSULE | Freq: Two times a day (BID) | ORAL | 0 refills | Status: AC

## 2024-08-03 NOTE — Assessment & Plan Note (Signed)
 Stable without medications. Continue lifestyle management, will likely need medications in the future. BMP collected today.

## 2024-08-03 NOTE — Assessment & Plan Note (Signed)
 Repeat TSH today. Not on medications.

## 2024-08-03 NOTE — Patient Instructions (Addendum)
 VISIT SUMMARY: During your visit, we discussed your diabetes management, symptoms of peripheral neuropathy, and genitourinary concerns. We also addressed your vitamin D  deficiency and performed necessary screenings for sexually transmitted infections.  YOUR PLAN: TYPE 2 DIABETES MELLITUS: You have diabetes with suboptimal glucose control and symptoms of peripheral neuropathy. -You are referred to an ophthalmologist for a retinal examination due to diabetes. -Use over-the-counter athlete's foot cream for possible fungal infection. -We repeated your A1c and urine protein testing today. Based on this result, I will be in contact for your prescriptions of Ozempic /Mounjaro .  SCREENING FOR SEXUALLY TRANSMITTED INFECTIONS: You reported abdominal cramping and occasional burning with urination, which may indicate an infection. -We performed blood tests for HIV, syphilis, and hepatitis C. Also performed swabs for gonorrhea and chlamydia.  PRIMARY HYPERTENSION: We need to assess your blood pressure control. -A basic metabolic panel was ordered to check your kidneys and electrolytes.  AUTOIMMUNE THYROIDITIS: We need to check your thyroid function. -A thyroid function test was ordered.  VITAMIN D  DEFICIENCY: You have low vitamin D  levels. -A vitamin D  level test was ordered.  DYSURIA: You have occasional burning during urination. -A urinalysis was performed to rule out a urinary tract infection. This could also cause lower abdominal pain.  GENERAL HEALTH MAINTENANCE: You are due for certain vaccines and an eye exam. -Flu and tetanus vaccines were administered. -You are referred to an ophthalmologist for a retinal examination.  Please let me know if you have any other questions. Please return to follow up in 1 month.  Dr. Tharon

## 2024-08-03 NOTE — Assessment & Plan Note (Signed)
 Previous use of Ozempic , now with new insurance coverage. Reports numbness in feet, especially the big toe, when standing for long periods, improving with rest. A1c today 12.2. - Start Mounjaro  2.5 mg weekly for now. Not a candidate for SGLT2 given degree of A1c elevation and risk of yeast infection at this time. - Will discuss initiation of insulin  given degree of elevation. - Started Lipitor 40 mg daily given T2DM. - Referred to ophthalmologist for retinal examination due to diabetes.

## 2024-08-03 NOTE — Progress Notes (Signed)
" ° °  SUBJECTIVE:   CHIEF COMPLAINT / HPI:  Discussed the use of AI scribe software for clinical note transcription with the patient, who gave verbal consent to proceed.  History of Present Illness Shelley Payne is a 29 year old female with diabetes who presents for diabetes management and STD testing.  Glycemic control - Diabetes mellitus with suboptimal glucose control - Previously used Ozempic , discontinued due to loss of insurance - Currently seeking improved glycemic management  Peripheral neuropathy symptoms - Numbness in feet when standing for prolonged periods, improves with rest - Increased numbness in big toe at times, but sensation overall remains intact  Visual health - New glasses obtained in June 2025 - No retinal exam performed at that time  Genitourinary and pelvic symptoms - New onset lower abdominal cramping resembling uterine cramps outside of menstrual cycle - Occasional mild burning with urination - History of prior chlamydia infection which felt the same way - No nausea, vomiting, bowel habit changes, vaginal discharge, itching, burning, pain, rashes, or genital bumps  Vitamin d  deficiency - Low vitamin D  history  PERTINENT  PMH / PSH: hypothyroidism  OBJECTIVE:  BP 128/87   Pulse 86   Ht 5' 2 (1.575 m)   Wt 228 lb 6.4 oz (103.6 kg)   LMP 07/24/2024   SpO2 98%   BMI 41.77 kg/m   Physical Exam GENERAL: Alert, cooperative, well developed, no acute distress HEENT: Normocephalic CHEST: Clear to auscultation bilaterally, No wheezes, rhonchi, or crackles CARDIOVASCULAR: Normal heart rate and rhythm, S1 and S2 normal without murmurs ABDOMEN: Soft, mild suprapubic tenderness without rebound or guarding, non-distended, Normal bowel sounds EXTREMITIES: No cyanosis or edema, bilateral feet WWP, scaling over entire sole of bilateral feet NEUROLOGICAL: Cranial nerves grossly intact, Moves all extremities without gross motor or sensory  deficit  ASSESSMENT/PLAN:   Assessment & Plan Type 2 diabetes mellitus with hyperglycemia, without long-term current use of insulin  (HCC) Previous use of Ozempic , now with new insurance coverage. Reports numbness in feet, especially the big toe, when standing for long periods, improving with rest. A1c today 12.2. - Start Mounjaro  2.5 mg weekly for now. Not a candidate for SGLT2 given degree of A1c elevation and risk of yeast infection at this time. - Will discuss initiation of insulin  given degree of elevation. - Started Lipitor 40 mg daily given T2DM. - Referred to ophthalmologist for retinal examination due to diabetes. Routine screening for STI (sexually transmitted infection) Acute cystitis without hematuria Reports lower abdominal cramping similar to previous chlamydia infection. Occasional dysuria noted. UA with nitrites and pyuria. Wet prep with many clue cells, pending Aptima swab. - Metrogel  at bedtime for 5 days for BV - Keflex  BID for 5 days for UTI - Performed blood tests for HIV, syphilis, and hepatitis C. - Will discuss consideration of partner treatment for BV. Hypothyroidism, acquired, autoimmune Repeat TSH today. Not on medications. Vitamin D  deficiency Repeat level today. Not on supplementation at this time. Primary hypertension Stable without medications. Continue lifestyle management, will likely need medications in the future. BMP collected today. Anemia, unspecified type No signs or symptoms, previous Hgb around 11. Update CBC. Encounter for immunization Tdap and flu given today. Tinea pedis of both feet Recommended over-the-counter athlete's foot cream for possible fungal infection, especially in setting of poorly controlled T2DM.  Stuart Redo, MD [facility]  "

## 2024-08-03 NOTE — Assessment & Plan Note (Signed)
 Repeat level today. Not on supplementation at this time.

## 2024-08-04 ENCOUNTER — Ambulatory Visit: Payer: Self-pay | Admitting: Family Medicine

## 2024-08-04 LAB — SYPHILIS: RPR W/REFLEX TO RPR TITER AND TREPONEMAL ANTIBODIES, TRADITIONAL SCREENING AND DIAGNOSIS ALGORITHM

## 2024-08-04 MED ORDER — CHOLECALCIFEROL 1.25 MG (50000 UT) PO TABS: 1.0000 | ORAL_TABLET | ORAL | 0 refills | Status: AC

## 2024-08-06 LAB — CERVICOVAGINAL ANCILLARY ONLY
Bacterial Vaginitis (gardnerella): POSITIVE — AB
Candida Glabrata: POSITIVE — AB
Candida Vaginitis: NEGATIVE
Chlamydia: NEGATIVE
Comment: NEGATIVE
Comment: NEGATIVE
Comment: NEGATIVE
Comment: NEGATIVE
Comment: NEGATIVE
Comment: NORMAL
Neisseria Gonorrhea: NEGATIVE
Trichomonas: NEGATIVE

## 2024-08-07 LAB — MICROALBUMIN / CREATININE URINE RATIO
Creatinine, Urine: 88.6 mg/dL
Microalb/Creat Ratio: 51 mg/g{creat} — ABNORMAL HIGH (ref 0–29)
Microalbumin, Urine: 44.9 ug/mL

## 2024-08-07 LAB — BASIC METABOLIC PANEL WITH GFR
BUN/Creatinine Ratio: 19 (ref 9–23)
BUN: 10 mg/dL (ref 6–20)
CO2: 21 mmol/L (ref 20–29)
Calcium: 9.5 mg/dL (ref 8.7–10.2)
Chloride: 101 mmol/L (ref 96–106)
Creatinine, Ser: 0.52 mg/dL — ABNORMAL LOW (ref 0.57–1.00)
Glucose: 308 mg/dL — ABNORMAL HIGH (ref 70–99)
Potassium: 4.1 mmol/L (ref 3.5–5.2)
Sodium: 138 mmol/L (ref 134–144)
eGFR: 128 mL/min/1.73

## 2024-08-07 LAB — CBC WITH DIFFERENTIAL/PLATELET
Basophils Absolute: 0 x10E3/uL (ref 0.0–0.2)
Basos: 0 %
EOS (ABSOLUTE): 0.4 x10E3/uL (ref 0.0–0.4)
Eos: 4 %
Hematocrit: 42.6 % (ref 34.0–46.6)
Hemoglobin: 12.8 g/dL (ref 11.1–15.9)
Immature Grans (Abs): 0 x10E3/uL (ref 0.0–0.1)
Immature Granulocytes: 0 %
Lymphocytes Absolute: 2.1 x10E3/uL (ref 0.7–3.1)
Lymphs: 24 %
MCH: 25.5 pg — ABNORMAL LOW (ref 26.6–33.0)
MCHC: 30 g/dL — ABNORMAL LOW (ref 31.5–35.7)
MCV: 85 fL (ref 79–97)
Monocytes Absolute: 0.4 x10E3/uL (ref 0.1–0.9)
Monocytes: 5 %
Neutrophils Absolute: 5.9 x10E3/uL (ref 1.4–7.0)
Neutrophils: 66 %
Platelets: 264 x10E3/uL (ref 150–450)
RBC: 5.01 x10E6/uL (ref 3.77–5.28)
RDW: 13.1 % (ref 11.7–15.4)
WBC: 8.9 x10E3/uL (ref 3.4–10.8)

## 2024-08-07 LAB — RPR, QUANT+TP ABS (REFLEX)
Rapid Plasma Reagin, Quant: 1:8 {titer} — ABNORMAL HIGH
T Pallidum Abs: REACTIVE — AB

## 2024-08-07 LAB — HIV ANTIBODY (ROUTINE TESTING W REFLEX): HIV Screen 4th Generation wRfx: NONREACTIVE

## 2024-08-07 LAB — VITAMIN D 25 HYDROXY (VIT D DEFICIENCY, FRACTURES): Vit D, 25-Hydroxy: 9.2 ng/mL — ABNORMAL LOW (ref 30.0–100.0)

## 2024-08-07 LAB — HEPATITIS C ANTIBODY: Hep C Virus Ab: NONREACTIVE

## 2024-08-07 LAB — TSH RFX ON ABNORMAL TO FREE T4: TSH: 4.08 u[IU]/mL (ref 0.450–4.500)

## 2024-08-07 LAB — SYPHILIS: RPR W/REFLEX TO RPR TITER AND TREPONEMAL ANTIBODIES, TRADITIONAL SCREENING AND DIAGNOSIS ALGORITHM: RPR Ser Ql: REACTIVE — AB

## 2024-08-10 ENCOUNTER — Ambulatory Visit: Admitting: Family Medicine

## 2024-08-10 ENCOUNTER — Ambulatory Visit: Payer: Self-pay | Admitting: Family Medicine

## 2024-08-10 ENCOUNTER — Encounter: Payer: Self-pay | Admitting: Family Medicine

## 2024-08-10 VITALS — BP 120/81 | HR 86 | Ht 62.0 in | Wt 228.5 lb

## 2024-08-10 DIAGNOSIS — A539 Syphilis, unspecified: Secondary | ICD-10-CM

## 2024-08-10 NOTE — Progress Notes (Signed)
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 Non painful, non ulcerating Any signs of secondary or tertiary syphilis? No  No further sexual contact with partner, possibly infected.   PERTINENT  PMH / PSH: HTN, T2DM  OBJECTIVE:   BP 120/81   Pulse 86   Ht 5' 2 (1.575 m)   Wt 228 lb 8 oz (103.6 kg)   LMP 07/26/2024   SpO2 99%   BMI 41.79 kg/m   General: A&O, NAD Cardiac: RRR, no m/r/g Respiratory: CTAB, normal WOB, no w/c/r Extremities: NTTP, no peripheral edema. Bilateral maculopapular rash on the soles of the feet. Neuro: Normal gait, moves all four extremities appropriately. CN 2-12 intact. Intact sensation and strength in all four extremities.    ASSESSMENT/PLAN:   Assessment & Plan Syphilis - patient has current new symptoms which would indicate activation of disease.  - reached out to [facility] infectious disease physician for treatment guidance, awaiting response.  - if no response by Monday, will reach out again, but would favor starting treatment per UptoDate guidelines as below:  - PCN IM once weekly for 3 weeks - will reach out to patient to schedule as soon as able.    Lucie Pinal, DO [facility] "

## 2024-08-10 NOTE — Patient Instructions (Signed)
 It was wonderful to see you today!  We will most likely need to treat you again for your infection. I am waiting to hear back from the Infectious Disease specialist and will call you when I have an answer.  Please call [phone] with any questions about today's appointment.   If you need any additional refills, please call your pharmacy before calling the office.  Lucie Pinal, DO Family Medicine

## 2024-08-13 ENCOUNTER — Telehealth: Payer: Self-pay

## 2024-08-13 ENCOUNTER — Telehealth: Payer: Self-pay | Admitting: Family Medicine

## 2024-08-13 ENCOUNTER — Other Ambulatory Visit (HOSPITAL_COMMUNITY): Payer: Self-pay

## 2024-08-13 DIAGNOSIS — A539 Syphilis, unspecified: Secondary | ICD-10-CM

## 2024-08-13 MED ORDER — PENICILLIN G BENZATHINE 1200000 UNIT/2ML IM SUSY: 2.4000 10*6.[IU] | PREFILLED_SYRINGE | INTRAMUSCULAR | Status: AC

## 2024-08-13 NOTE — Telephone Encounter (Signed)
 Pharmacy Patient Advocate Encounter   Received notification from [employer] that prior authorization for Mounjaro  2.5MG /0.5ML auto-injectors is required/requested.   Insurance verification completed.   The patient is insured through [facility] [employer].   Per test claim: PA required; PA submitted to above mentioned insurance via Latent Key/confirmation #/[id number] Status is pending

## 2024-08-13 NOTE — Telephone Encounter (Signed)
 After discussion with Dr. Rumball regarding patient's new symptomatology, called patient to discuss next steps. After confirming her name and date of birth, scheduled her to come in once every three weeks to receive IM penicillin  treatment. Patient will need to follow up 6 months after her final treatment to repeat her RPR titers.

## 2024-08-14 ENCOUNTER — Other Ambulatory Visit (HOSPITAL_COMMUNITY): Payer: Self-pay

## 2024-08-14 NOTE — Telephone Encounter (Signed)
 Pharmacy Patient Advocate Encounter  Received notification from [facility] [employer] that Prior Authorization for Mounjaro  2.5MG /0.5ML auto-injectors  has been APPROVED from 08/14/24 to 08/14/25. Ran test claim, Copay is $59.55. This test claim was processed through [facility]- copay amounts may vary at other pharmacies due to pharmacy/plan contracts, or as the patient moves through the different stages of their insurance plan.   PA #/Case ID/Reference #: [id number]

## 2024-08-16 ENCOUNTER — Ambulatory Visit

## 2024-08-16 ENCOUNTER — Telehealth: Payer: Self-pay | Admitting: Family Medicine

## 2024-08-16 DIAGNOSIS — A539 Syphilis, unspecified: Secondary | ICD-10-CM

## 2024-08-16 MED ORDER — PENICILLIN G BENZATHINE 1200000 UNIT/2ML IM SUSY
1.2000 10*6.[IU] | PREFILLED_SYRINGE | Freq: Once | INTRAMUSCULAR | Status: AC
  Administered 2024-08-16: 1.2 10*6.[IU] via INTRAMUSCULAR

## 2024-08-16 NOTE — Progress Notes (Signed)
 Patient presents to nurse clinic today for treatment of Syphilis  1.2 million units of bicillin  given in LUOQ and 1.2 million units of bicillin  given in RUOQ per Dr. Dianne orders.  Patient observed 15 minutes in office.   No reaction noted.   Patient to return to nurse clinic on 1/29 for #2 of (3) treatments.

## 2024-08-16 NOTE — Telephone Encounter (Signed)
 Discussed with patient during her nurse visit this morning.  She reports she will contact [facility] for pricing. She reports if wants the prescription transferred to a [facility] she will let us  know.   All questions answered.

## 2024-08-16 NOTE — Telephone Encounter (Signed)
 Patient dropped off form at front desk for work.  Verified that patient section of form has been completed.  Last DOS/WCC with PCP was 08/10/2024.  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  Shelley Payne

## 2024-08-16 NOTE — Telephone Encounter (Signed)
 Reviewed form and placed in PCP's box for completion.  Cena JONELLE Pesa, CMA

## 2024-08-17 ENCOUNTER — Ambulatory Visit: Payer: Self-pay

## 2024-08-23 ENCOUNTER — Ambulatory Visit

## 2024-08-24 ENCOUNTER — Ambulatory Visit: Payer: Self-pay

## 2024-08-24 ENCOUNTER — Telehealth: Payer: Self-pay | Admitting: Family Medicine

## 2024-08-24 ENCOUNTER — Ambulatory Visit

## 2024-08-24 DIAGNOSIS — A539 Syphilis, unspecified: Secondary | ICD-10-CM | POA: Diagnosis not present

## 2024-08-24 MED ORDER — PENICILLIN G BENZATHINE 1200000 UNIT/2ML IM SUSY
1.2000 10*6.[IU] | PREFILLED_SYRINGE | Freq: Once | INTRAMUSCULAR | Status: AC
  Administered 2024-08-24: 1.2 10*6.[IU] via INTRAMUSCULAR

## 2024-08-24 NOTE — Progress Notes (Signed)
 Patient presents to nurse clinic today for treatment of Syphilis   1.2 million units of bicillin  given in LUOQ and 1.2 million units of bicillin  given in RUOQ per Dr. Dianne orders.  Patient observed 15 minutes in office.   No reaction noted.    Patient to return to nurse clinic on 08/30/2024  for #3 of (3) treatments.

## 2024-08-24 NOTE — Telephone Encounter (Signed)
 Patient came in stating she wanted Dr.Spence to call her wanting to talk about her paperwork for work.  I did tell patient if she wanted to discuss paperwork, she would have to make an appointment but was addiment about Dr.Spence calling.  Thank you.

## 2024-08-30 ENCOUNTER — Ambulatory Visit

## 2024-08-30 ENCOUNTER — Ambulatory Visit: Payer: Self-pay

## 2024-08-31 ENCOUNTER — Ambulatory Visit

## 2024-08-31 ENCOUNTER — Ambulatory Visit: Payer: Self-pay

## 2024-08-31 VITALS — BP 115/92 | HR 94 | Ht 62.0 in | Wt 229.0 lb

## 2024-08-31 DIAGNOSIS — G8929 Other chronic pain: Secondary | ICD-10-CM

## 2024-08-31 DIAGNOSIS — E1165 Type 2 diabetes mellitus with hyperglycemia: Secondary | ICD-10-CM

## 2024-08-31 DIAGNOSIS — R112 Nausea with vomiting, unspecified: Secondary | ICD-10-CM

## 2024-08-31 DIAGNOSIS — R1032 Left lower quadrant pain: Secondary | ICD-10-CM

## 2024-08-31 DIAGNOSIS — A539 Syphilis, unspecified: Secondary | ICD-10-CM

## 2024-08-31 LAB — POCT UA - MICROSCOPIC ONLY

## 2024-08-31 LAB — POCT URINE PREGNANCY: Preg Test, Ur: NEGATIVE

## 2024-08-31 LAB — POCT URINALYSIS DIP (MANUAL ENTRY)
Bilirubin, UA: NEGATIVE
Glucose, UA: >=1000 mg/dL — AB
Leukocytes, UA: NEGATIVE
Nitrite, UA: POSITIVE — AB
Protein Ur, POC: 30 mg/dL — AB
Spec Grav, UA: 1.015
Urobilinogen, UA: 0.2 U/dL
pH, UA: 5.5

## 2024-08-31 LAB — GLUCOSE, POCT (MANUAL RESULT ENTRY): POC Glucose: 341 mg/dL — AB (ref 70–99)

## 2024-08-31 MED ORDER — INSULIN GLARGINE 100 UNIT/ML SOLOSTAR PEN: 10.0000 [IU] | PEN_INJECTOR | Freq: Every day | SUBCUTANEOUS | 0 refills | Status: AC

## 2024-08-31 MED ORDER — PENICILLIN G BENZATHINE 1200000 UNIT/2ML IM SUSY
1.2000 10*6.[IU] | PREFILLED_SYRINGE | Freq: Once | INTRAMUSCULAR | Status: AC
  Administered 2024-08-31: 1.2 10*6.[IU] via INTRAMUSCULAR

## 2024-08-31 NOTE — Progress Notes (Signed)
 Patient presents to nurse clinic today for treatment of Syphilis   1.2 million units of bicillin  given in LUOQ and 1.2 million units of bicillin  given in RUOQ per Dr. Dianne orders.  Patient observed 15 minutes in office.   No reaction noted.    Patient has completed all (3) treatments.
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
## 2024-09-05 ENCOUNTER — Ambulatory Visit: Payer: Self-pay | Admitting: Family Medicine
